# Patient Record
Sex: Female | Born: 1950 | Race: White | Hispanic: No | Marital: Married | Smoking: Never smoker
Health system: Southern US, Community
[De-identification: ages and names within clinical notes are randomized; demographics above are authoritative.]

## PROBLEM LIST (undated history)

## (undated) DIAGNOSIS — Z780 Asymptomatic menopausal state: Secondary | ICD-10-CM

## (undated) DIAGNOSIS — N809 Endometriosis, unspecified: Secondary | ICD-10-CM

## (undated) DIAGNOSIS — D490 Neoplasm of unspecified behavior of digestive system: Principal | ICD-10-CM

## (undated) DIAGNOSIS — E559 Vitamin D deficiency, unspecified: Secondary | ICD-10-CM

## (undated) DIAGNOSIS — Z8719 Personal history of other diseases of the digestive system: Secondary | ICD-10-CM

## (undated) DIAGNOSIS — E7800 Pure hypercholesterolemia, unspecified: Secondary | ICD-10-CM

## (undated) DIAGNOSIS — J309 Allergic rhinitis, unspecified: Secondary | ICD-10-CM

## (undated) DIAGNOSIS — F419 Anxiety disorder, unspecified: Secondary | ICD-10-CM

## (undated) DIAGNOSIS — C4491 Basal cell carcinoma of skin, unspecified: Secondary | ICD-10-CM

## (undated) DIAGNOSIS — Z8619 Personal history of other infectious and parasitic diseases: Secondary | ICD-10-CM

## (undated) DIAGNOSIS — K862 Cyst of pancreas: Secondary | ICD-10-CM

## (undated) HISTORY — DX: Neoplasm of unspecified behavior of digestive system: D49.0

## (undated) HISTORY — DX: Personal history of other infectious and parasitic diseases: Z86.19

## (undated) HISTORY — DX: Allergic rhinitis, unspecified: J30.9

## (undated) HISTORY — PX: TONSILLECTOMY AND ADENOIDECTOMY: SHX28

## (undated) HISTORY — DX: Basal cell carcinoma of skin, unspecified: C44.91

## (undated) HISTORY — DX: Personal history of other diseases of the digestive system: Z87.19

## (undated) HISTORY — DX: Anxiety disorder, unspecified: F41.9

## (undated) HISTORY — PX: BREAST FIBROADENOMA SURGERY: SHX580

## (undated) HISTORY — DX: Asymptomatic menopausal state: Z78.0

## (undated) HISTORY — DX: Pure hypercholesterolemia, unspecified: E78.00

## (undated) HISTORY — DX: Vitamin D deficiency, unspecified: E55.9

## (undated) HISTORY — DX: Cyst of pancreas: K86.2

## (undated) HISTORY — DX: Endometriosis, unspecified: N80.9

---

## 1983-11-06 HISTORY — PX: BREAST EXCISIONAL BIOPSY: SUR124

## 1998-03-07 ENCOUNTER — Other Ambulatory Visit: Admission: RE | Admit: 1998-03-07 | Discharge: 1998-03-07 | Payer: Self-pay | Admitting: Obstetrics & Gynecology

## 1999-08-01 ENCOUNTER — Other Ambulatory Visit: Admission: RE | Admit: 1999-08-01 | Discharge: 1999-08-01 | Payer: Self-pay | Admitting: Obstetrics & Gynecology

## 2000-08-28 ENCOUNTER — Other Ambulatory Visit: Admission: RE | Admit: 2000-08-28 | Discharge: 2000-08-28 | Payer: Self-pay | Admitting: Obstetrics & Gynecology

## 2001-09-01 ENCOUNTER — Other Ambulatory Visit: Admission: RE | Admit: 2001-09-01 | Discharge: 2001-09-01 | Payer: Self-pay | Admitting: Obstetrics & Gynecology

## 2002-12-07 ENCOUNTER — Other Ambulatory Visit: Admission: RE | Admit: 2002-12-07 | Discharge: 2002-12-07 | Payer: Self-pay | Admitting: Obstetrics and Gynecology

## 2002-12-08 ENCOUNTER — Other Ambulatory Visit: Admission: RE | Admit: 2002-12-08 | Discharge: 2002-12-08 | Payer: Self-pay | Admitting: Obstetrics & Gynecology

## 2003-05-03 ENCOUNTER — Other Ambulatory Visit: Admission: RE | Admit: 2003-05-03 | Discharge: 2003-05-03 | Payer: Self-pay | Admitting: Obstetrics & Gynecology

## 2004-01-11 ENCOUNTER — Other Ambulatory Visit: Admission: RE | Admit: 2004-01-11 | Discharge: 2004-01-11 | Payer: Self-pay | Admitting: Obstetrics & Gynecology

## 2005-04-16 ENCOUNTER — Other Ambulatory Visit: Admission: RE | Admit: 2005-04-16 | Discharge: 2005-04-16 | Payer: Self-pay | Admitting: Obstetrics & Gynecology

## 2011-10-12 ENCOUNTER — Encounter (INDEPENDENT_AMBULATORY_CARE_PROVIDER_SITE_OTHER): Payer: BC Managed Care – PPO | Admitting: Ophthalmology

## 2011-10-12 DIAGNOSIS — H2510 Age-related nuclear cataract, unspecified eye: Secondary | ICD-10-CM

## 2011-10-12 DIAGNOSIS — Q120 Congenital cataract: Secondary | ICD-10-CM

## 2011-10-12 DIAGNOSIS — H43819 Vitreous degeneration, unspecified eye: Secondary | ICD-10-CM

## 2011-10-19 ENCOUNTER — Encounter (INDEPENDENT_AMBULATORY_CARE_PROVIDER_SITE_OTHER): Payer: Self-pay | Admitting: Ophthalmology

## 2012-01-15 ENCOUNTER — Other Ambulatory Visit: Payer: Self-pay | Admitting: Physician Assistant

## 2012-06-11 ENCOUNTER — Encounter: Payer: Self-pay | Admitting: Internal Medicine

## 2012-06-11 ENCOUNTER — Other Ambulatory Visit: Payer: Self-pay | Admitting: Internal Medicine

## 2012-06-11 ENCOUNTER — Telehealth: Payer: Self-pay

## 2012-06-11 ENCOUNTER — Ambulatory Visit (INDEPENDENT_AMBULATORY_CARE_PROVIDER_SITE_OTHER): Payer: BC Managed Care – PPO | Admitting: Internal Medicine

## 2012-06-11 VITALS — BP 114/67 | HR 67 | Temp 98.1°F | Resp 16 | Ht 67.5 in | Wt 158.0 lb

## 2012-06-11 DIAGNOSIS — Z0000 Encounter for general adult medical examination without abnormal findings: Secondary | ICD-10-CM

## 2012-06-11 LAB — CBC WITH DIFFERENTIAL/PLATELET
Basophils Absolute: 0.1 10*3/uL (ref 0.0–0.1)
Eosinophils Absolute: 0.1 10*3/uL (ref 0.0–0.7)
Eosinophils Relative: 3 % (ref 0–5)
Lymphocytes Relative: 44 % (ref 12–46)
MCV: 91.1 fL (ref 78.0–100.0)
Neutrophils Relative %: 40 % — ABNORMAL LOW (ref 43–77)
Platelets: 207 10*3/uL (ref 150–400)
RDW: 13.6 % (ref 11.5–15.5)
WBC: 3.2 10*3/uL — ABNORMAL LOW (ref 4.0–10.5)

## 2012-06-11 LAB — TSH: TSH: 2.405 u[IU]/mL (ref 0.350–4.500)

## 2012-06-11 LAB — POCT UA - MICROSCOPIC ONLY: Yeast, UA: NEGATIVE

## 2012-06-11 LAB — COMPREHENSIVE METABOLIC PANEL
ALT: 12 U/L (ref 0–35)
AST: 14 U/L (ref 0–37)
Chloride: 105 mEq/L (ref 96–112)
Creat: 0.77 mg/dL (ref 0.50–1.10)
Sodium: 140 mEq/L (ref 135–145)
Total Bilirubin: 0.7 mg/dL (ref 0.3–1.2)

## 2012-06-11 LAB — POCT URINALYSIS DIPSTICK
Glucose, UA: NEGATIVE
Leukocytes, UA: NEGATIVE
Nitrite, UA: NEGATIVE

## 2012-06-11 LAB — LIPID PANEL
Total CHOL/HDL Ratio: 3.5 Ratio
VLDL: 14 mg/dL (ref 0–40)

## 2012-06-11 NOTE — Telephone Encounter (Signed)
Spoke to pt and informed I would forward message to [facility] in regards to sleep aids also would like Vit D level added on because she had Vit D performed last year and it was low so she has been taking Vit D supplement and wanted to see if it had improved.

## 2012-06-11 NOTE — Telephone Encounter (Signed)
Pt is wanting to know if the blood work that was drawn today was checking for vitamin D  And wants to know the three types of sleep aids that they discussed

## 2012-06-11 NOTE — Telephone Encounter (Signed)
Vit d yes Valerian root 400-600mg --[place] [facility] sleepytime tea--[facility] Melatonin 3mg -[place]

## 2012-06-12 ENCOUNTER — Encounter: Payer: Self-pay | Admitting: Internal Medicine

## 2012-06-12 NOTE — Telephone Encounter (Signed)
Called [place] and added on Vit D level spoke to [place].

## 2012-06-12 NOTE — Telephone Encounter (Signed)
Spoke w/Renee in Lab and they will call and have Vit D level added.

## 2012-06-12 NOTE — Telephone Encounter (Signed)
Left mssg for her to call me back 

## 2012-06-12 NOTE — Telephone Encounter (Signed)
Waiting for pt to call back to go over Sleep Aids, but need to add on Vit D level per Merla Riches.

## 2012-06-12 NOTE — Progress Notes (Signed)
  Subjective:    Patient ID: Elizabeth Cabrera, female    DOB: 1951-01-21, 61 y.o.   MRN: [id number]  HPIHere for annual exam Over the past 6 months has been experiencing with the gluten-free diet because of persistent long-term gastrointestinal issues that were finally linked to eating wheat-containing products These include lots of abdominal symptoms like bloating constipation or diarrhea appetite changes and also other issues like fatigue and myalgia. She is much better on a gluten-free diet/her daughter who shared in similar issues including infertility responded to the diet as well  Immunizations up to date Pap smear within normal limits last year/mammogram also/has GYN Exercise good-still complains of inability to lose last 10-15 pounds No other routine medical problems Except occasional insomnia for which she takes a fourth tablet of Xanax Husband With Aspergers/Stable relationship C. Issues with sleep/recent sleep study revealed no apnea/? 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 Routine general medical examination at a health care facility  CBC with Differential, Comprehensive metabolic panel, Lipid panel, TSH, POCT UA - Microscopic Only, POCT urinalysis dipstick  2  Insomnia 3.  HSV 4.  Gluten hypersensitivity-HO given Meds ordered this encounter  Medications  . ALPRAZolam (XANAX) 0.5 MG tablet    Sig: Take 0.5 mg by mouth at  bedtime as needed.  . valACYclovir (VALTREX) 1000 MG tablet    Sig: Take 1,000 mg by mouth 2 (two) times daily.  . calcium-vitamin D 250-100 MG-UNIT per tablet    Sig: Take 1 tablet by mouth 2 (two) times daily. 400 iu/500 mg  To call for refills as needed and followup in 1 year

## 2012-06-13 ENCOUNTER — Telehealth: Payer: Self-pay | Admitting: Radiology

## 2012-06-16 NOTE — Telephone Encounter (Signed)
Med encounter only 

## 2012-07-08 ENCOUNTER — Telehealth: Payer: Self-pay

## 2012-07-08 ENCOUNTER — Encounter: Payer: Self-pay | Admitting: Internal Medicine

## 2012-07-08 NOTE — Telephone Encounter (Signed)
ok 

## 2012-07-08 NOTE — Telephone Encounter (Signed)
Spoke with pt advised Vit D level. Pt understood

## 2012-07-08 NOTE — Telephone Encounter (Signed)
PATIENT REQUESTING RESULTS OF VITAMIN D TESTING.   BEST PHONE [phone]

## 2013-02-06 ENCOUNTER — Encounter: Payer: Self-pay | Admitting: Internal Medicine

## 2014-07-21 ENCOUNTER — Encounter: Payer: Self-pay | Admitting: Internal Medicine

## 2014-07-21 ENCOUNTER — Ambulatory Visit (INDEPENDENT_AMBULATORY_CARE_PROVIDER_SITE_OTHER): Payer: BC Managed Care – PPO | Admitting: Internal Medicine

## 2014-07-21 VITALS — BP 118/80 | HR 64 | Temp 98.7°F | Resp 16 | Ht 67.5 in | Wt 151.8 lb

## 2014-07-21 DIAGNOSIS — K900 Celiac disease: Secondary | ICD-10-CM

## 2014-07-21 DIAGNOSIS — Z0000 Encounter for general adult medical examination without abnormal findings: Secondary | ICD-10-CM

## 2014-07-21 DIAGNOSIS — R635 Abnormal weight gain: Secondary | ICD-10-CM

## 2014-07-21 MED ORDER — VALACYCLOVIR HCL 1 G PO TABS: 1000.0000 mg | ORAL_TABLET | Freq: Two times a day (BID) | ORAL | Status: DC

## 2014-07-21 MED ORDER — ALPRAZOLAM 0.5 MG PO TABS: 0.5000 mg | ORAL_TABLET | Freq: Every evening | ORAL | Status: DC | PRN

## 2014-07-22 LAB — LIPID PANEL
Cholesterol: 248 mg/dL — ABNORMAL HIGH (ref 0–200)
HDL: 101 mg/dL (ref >39)
LDL CALC: 139 mg/dL — AB (ref 0–99)
Total CHOL/HDL Ratio: 2.5 Ratio
Triglycerides: 40 mg/dL (ref <150)
VLDL: 8 mg/dL (ref 0–40)

## 2014-07-22 LAB — COMPLETE METABOLIC PANEL WITH GFR
ALBUMIN: 4.6 g/dL (ref 3.5–5.2)
ALT: 17 U/L (ref 0–35)
AST: 18 U/L (ref 0–37)
Alkaline Phosphatase: 50 U/L (ref 39–117)
BILIRUBIN TOTAL: 0.7 mg/dL (ref 0.2–1.2)
BUN: 10 mg/dL (ref 6–23)
CALCIUM: 9.6 mg/dL (ref 8.4–10.5)
CHLORIDE: 102 meq/L (ref 96–112)
CO2: 29 meq/L (ref 19–32)
Creat: 0.78 mg/dL (ref 0.50–1.10)
GFR, EST NON AFRICAN AMERICAN: 81 mL/min
GLUCOSE: 93 mg/dL (ref 70–99)
Potassium: 4.5 mEq/L (ref 3.5–5.3)
SODIUM: 138 meq/L (ref 135–145)
TOTAL PROTEIN: 7 g/dL (ref 6.0–8.3)

## 2014-07-22 LAB — CBC WITH DIFFERENTIAL/PLATELET
Basophils Absolute: 0 10*3/uL (ref 0.0–0.1)
Basophils Relative: 1 % (ref 0–1)
EOS ABS: 0.1 10*3/uL (ref 0.0–0.7)
Eosinophils Relative: 3 % (ref 0–5)
HCT: 42.1 % (ref 36.0–46.0)
HEMOGLOBIN: 14.3 g/dL (ref 12.0–15.0)
LYMPHS ABS: 1.8 10*3/uL (ref 0.7–4.0)
LYMPHS PCT: 41 % (ref 12–46)
MCH: 30.2 pg (ref 26.0–34.0)
MCHC: 34 g/dL (ref 30.0–36.0)
MCV: 89 fL (ref 78.0–100.0)
MONOS PCT: 10 % (ref 3–12)
Monocytes Absolute: 0.4 10*3/uL (ref 0.1–1.0)
NEUTROS PCT: 45 % (ref 43–77)
Neutro Abs: 2 10*3/uL (ref 1.7–7.7)
Platelets: 227 10*3/uL (ref 150–400)
RBC: 4.73 MIL/uL (ref 3.87–5.11)
RDW: 14 % (ref 11.5–15.5)
WBC: 4.4 10*3/uL (ref 4.0–10.5)

## 2014-07-22 LAB — POCT URINALYSIS DIPSTICK
Bilirubin, UA: NEGATIVE
Blood, UA: NEGATIVE
Glucose, UA: NEGATIVE
KETONES UA: NEGATIVE
Leukocytes, UA: NEGATIVE
Nitrite, UA: NEGATIVE
Protein, UA: NEGATIVE
Spec Grav, UA: <=1.005
Urobilinogen, UA: 0.2
pH, UA: 7

## 2014-07-22 LAB — VITAMIN B12: VITAMIN B 12: 557 pg/mL (ref 211–911)

## 2014-07-22 LAB — TSH: TSH: 2.397 u[IU]/mL (ref 0.350–4.500)
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
## 2014-07-26 ENCOUNTER — Encounter: Payer: Self-pay | Admitting: Internal Medicine

## 2014-07-27 LAB — HM PAP SMEAR: HM PAP: NEGATIVE

## 2014-07-28 ENCOUNTER — Encounter: Payer: Self-pay | Admitting: Family Medicine

## 2014-07-30 ENCOUNTER — Telehealth: Payer: Self-pay | Admitting: *Deleted

## 2014-07-30 NOTE — Telephone Encounter (Signed)
Labs all ok --results sent last week in letter??

## 2014-07-30 NOTE — Telephone Encounter (Signed)
Spoke to pt advised results.  Pt request email to get access code to [place]. I have verified with her name/DOB and last four of social. Emailed to her the code after resetting.

## 2014-07-30 NOTE — Telephone Encounter (Signed)
Pt called regarding lab results.  Can you review them?

## 2015-05-28 ENCOUNTER — Telehealth: Payer: Self-pay | Admitting: Family Medicine

## 2015-05-28 NOTE — Telephone Encounter (Signed)
lmom to call and reschedule her appt that she had with Laney Pastor on 07/27/15 he will not be in the clinic that day

## 2015-07-27 ENCOUNTER — Encounter: Payer: Self-pay | Admitting: Internal Medicine

## 2015-08-05 LAB — HM MAMMOGRAPHY

## 2015-08-10 ENCOUNTER — Encounter: Payer: Self-pay | Admitting: Internal Medicine

## 2015-09-21 ENCOUNTER — Encounter: Payer: Self-pay | Admitting: Internal Medicine

## 2015-12-14 ENCOUNTER — Telehealth: Payer: Self-pay | Admitting: Family Medicine

## 2015-12-14 NOTE — Telephone Encounter (Signed)
Spoke with patient and she had her mammogram and pap smear done at [facility].  Called them and they will fax over the reports.

## 2015-12-19 ENCOUNTER — Encounter: Payer: Self-pay | Admitting: Family Medicine

## 2015-12-21 ENCOUNTER — Ambulatory Visit: Payer: Self-pay | Admitting: Internal Medicine

## 2016-01-25 ENCOUNTER — Encounter: Payer: Self-pay | Admitting: Internal Medicine

## 2016-01-25 ENCOUNTER — Ambulatory Visit (INDEPENDENT_AMBULATORY_CARE_PROVIDER_SITE_OTHER): Payer: BC Managed Care – PPO | Admitting: Internal Medicine

## 2016-01-25 VITALS — BP 163/76 | HR 66 | Temp 97.7°F | Resp 18 | Ht 67.5 in | Wt 159.0 lb

## 2016-01-25 DIAGNOSIS — Z0000 Encounter for general adult medical examination without abnormal findings: Secondary | ICD-10-CM | POA: Diagnosis not present

## 2016-01-25 DIAGNOSIS — R252 Cramp and spasm: Secondary | ICD-10-CM

## 2016-01-25 DIAGNOSIS — E7800 Pure hypercholesterolemia, unspecified: Secondary | ICD-10-CM | POA: Diagnosis not present

## 2016-01-25 DIAGNOSIS — K900 Celiac disease: Secondary | ICD-10-CM

## 2016-01-25 NOTE — Patient Instructions (Addendum)
[facility] [email or web address]       IF you received an x-ray today, you will receive an invoice from [facility]. Please contact [facility] at [phone] with questions or concerns regarding your invoice.   IF you received labwork today, you will receive an invoice from [employer]. Please contact [facility] at [phone] with questions or concerns regarding your invoice.   Our billing staff will not be able to assist you with questions regarding bills from these companies.  You will be contacted with the lab results as soon as they are available. The fastest way to get your results is to activate your My Chart account. Instructions are located on the last page of this paperwork. If you have not heard from [place] regarding the results in 2 weeks, please contact this office.

## 2016-01-26 ENCOUNTER — Telehealth: Payer: Self-pay

## 2016-01-26 LAB — CBC WITH DIFFERENTIAL/PLATELET
BASOS PCT: 2 % — AB (ref 0–1)
Basophils Absolute: 0.1 10*3/uL (ref 0.0–0.1)
EOS ABS: 0.1 10*3/uL (ref 0.0–0.7)
Eosinophils Relative: 2 % (ref 0–5)
HCT: 41.4 % (ref 36.0–46.0)
Hemoglobin: 13.9 g/dL (ref 12.0–15.0)
Lymphocytes Relative: 38 % (ref 12–46)
Lymphs Abs: 1.8 10*3/uL (ref 0.7–4.0)
MCH: 29.8 pg (ref 26.0–34.0)
MCHC: 33.6 g/dL (ref 30.0–36.0)
MCV: 88.8 fL (ref 78.0–100.0)
MONO ABS: 0.4 10*3/uL (ref 0.1–1.0)
MONOS PCT: 8 % (ref 3–12)
MPV: 11.8 fL (ref 8.6–12.4)
NEUTROS PCT: 50 % (ref 43–77)
Neutro Abs: 2.4 10*3/uL (ref 1.7–7.7)
PLATELETS: 230 10*3/uL (ref 150–400)
RBC: 4.66 MIL/uL (ref 3.87–5.11)
RDW: 14 % (ref 11.5–15.5)
WBC: 4.8 10*3/uL (ref 4.0–10.5)

## 2016-01-26 LAB — COMPREHENSIVE METABOLIC PANEL
ALT: 13 U/L (ref 6–29)
AST: 16 U/L (ref 10–35)
Albumin: 4.5 g/dL (ref 3.6–5.1)
Alkaline Phosphatase: 45 U/L (ref 33–130)
BUN: 16 mg/dL (ref 7–25)
CHLORIDE: 101 mmol/L (ref 98–110)
CO2: 26 mmol/L (ref 20–31)
CREATININE: 0.78 mg/dL (ref 0.50–0.99)
Calcium: 9.7 mg/dL (ref 8.6–10.4)
Glucose, Bld: 88 mg/dL (ref 65–99)
Potassium: 4 mmol/L (ref 3.5–5.3)
SODIUM: 139 mmol/L (ref 135–146)
Total Bilirubin: 0.9 mg/dL (ref 0.2–1.2)
Total Protein: 7.2 g/dL (ref 6.1–8.1)

## 2016-01-26 LAB — LIPID PANEL
Cholesterol: 275 mg/dL — ABNORMAL HIGH (ref 125–200)
HDL: 104 mg/dL (ref >=46)
LDL CALC: 160 mg/dL — AB (ref <130)
Total CHOL/HDL Ratio: 2.6 Ratio (ref <=5.0)
Triglycerides: 53 mg/dL (ref <150)
VLDL: 11 mg/dL (ref <30)

## 2016-01-26 LAB — HEPATITIS C ANTIBODY: HCV AB: NEGATIVE

## 2016-01-26 LAB — CK: Total CK: 53 U/L (ref 7–177)

## 2016-01-26 LAB — TSH: TSH: 1.69 mIU/L

## 2016-01-26 NOTE — Telephone Encounter (Signed)
Pt was calling to find out code for ordered lab: CELIAC DISEASE HLA DQ ASSOC.// called clinical TL and Lab Techs; attempted code search but they informed me that this is that type of lab that will processed at [place]. Provided pt alt number to contact [facility] for code @ [phone].

## 2016-01-27 ENCOUNTER — Other Ambulatory Visit: Payer: Self-pay | Admitting: *Deleted

## 2016-01-27 DIAGNOSIS — K900 Celiac disease: Secondary | ICD-10-CM

## 2016-01-27 NOTE — Progress Notes (Signed)
Subjective:    Patient ID: Elizabeth Cabrera, female    DOB: Jul 09, 1951, 65 y.o.   MRN: [id number]  HPIannual Patient Active Problem List   Diagnosis Date Noted  . Elevated cholesterol 01/25/2016    -  ?gluten Hypersensitivity  She has been on a gluten-free diet for over 2 years and has tremendous IBS type symptoms whenever she comes in contact with gluten. Diagnosis has never been made by biopsy or positive blood test. There are several family members (daughter, son, 2 grandkids) share the same problem and our own gluten-free diets. She has problems with occasional scalp dermatitis and various skin bumps that come and go without definition Last colo- 2009 wnl  Active Ambulatory Problems    Diagnosis Date Noted  . Elevated cholesterol 01/25/2016  Never meets defn for rx  Past Medical History  Diagnosis Date  . History of IBS   . History of HPV infection   . Menopause   . Anxiety   . AR (allergic rhinitis)   . BCC (basal cell carcinoma)   . 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 Brother died at 85 the heart attack. All the brothers were smokers and overweight.  Review of Systems 14pt per formoccas anx esp w/ work and relat issues occas lightheadedness w/out sequelae Various joint pains especially when stress at her job//especially right shoulder and right elbow She has muscle cramps feet and calves on and off. Very occasional hand cramps at night. She had thick toenails thought to be secondary to fungal infection which resolved over the few years she has been on gluten-free diet Otherwise neg    Objective:   Physical Exam  Constitutional: She is oriented to person, place, and time. She appears well-developed and well-nourished. No distress.  HENT:  Head: Normocephalic.  Right Ear:  External ear normal.  Left Ear: External ear normal.  Nose: Nose normal.  Mouth/Throat: Oropharynx is clear and moist.  Eyes: Conjunctivae and EOM are normal. Pupils are equal, round, and reactive to light.  Neck: Normal range of motion. Neck supple. No thyromegaly present.  Cardiovascular: Normal rate, regular rhythm, normal heart sounds and intact distal pulses.   No murmur heard. Pulmonary/Chest: Effort normal and breath sounds normal. She has no wheezes.  Abdominal: Soft. Bowel sounds are normal. She exhibits no distension and no mass. There is no tenderness. There is no rebound.  Musculoskeletal: Normal range of motion. She exhibits no edema or tenderness.  Lymphadenopathy:    She has no cervical adenopathy.  Neurological: She is alert and oriented to person, place, and time. She has normal reflexes. No cranial nerve deficit.  Skin: Skin is warm and dry. No rash noted.  Psychiatric: She has a normal mood and affect. Her behavior is normal. Judgment and thought content normal.  Nursing note and vitals reviewed. BP 163/76 mmHg  Pulse 66  Temp(Src) 97.7 F (36.5 C)  Resp 18  Ht 5' 7.5" (1.715 m)  Wt 159 lb (72.122 kg)  BMI 24.52 kg/m2  155/75 recheck  EKG within normal limits     Assessment & Plan:  Annual physical exam -  Elevated cholesterol  Transient gluten sensitivity -?Celiac disease - Plan: Celiac Disease [facility] to be done at [facility]  Muscle cramps - Plan: CK///hydration///potassium rich foods  Elevated blood  pressure without diagnosis of hypertension--home blood pressures  Labs Results for orders placed or performed in visit on 01/25/16  CBC with Differential/Platelet  Result Value Ref Range   WBC 4.8 4.0 - 10.5 K/uL   RBC 4.66 3.87 - 5.11 MIL/uL   Hemoglobin 13.9 12.0 - 15.0 g/dL   HCT 41.4 36.0 - 46.0 %   MCV 88.8 78.0 - 100.0 fL   MCH 29.8 26.0 - 34.0 pg   MCHC 33.6 30.0 - 36.0 g/dL   RDW 14.0 11.5 - 15.5 %   Platelets 230 150 - 400 K/uL    MPV 11.8 8.6 - 12.4 fL   Neutrophils Relative % 50 43 - 77 %   Neutro Abs 2.4 1.7 - 7.7 K/uL   Lymphocytes Relative 38 12 - 46 %   Lymphs Abs 1.8 0.7 - 4.0 K/uL   Monocytes Relative 8 3 - 12 %   Monocytes Absolute 0.4 0.1 - 1.0 K/uL   Eosinophils Relative 2 0 - 5 %   Eosinophils Absolute 0.1 0.0 - 0.7 K/uL   Basophils Relative 2 (H) 0 - 1 %   Basophils Absolute 0.1 0.0 - 0.1 K/uL   Smear Review Criteria for review not met   Comprehensive metabolic panel  Result Value Ref Range   Sodium 139 135 - 146 mmol/L   Potassium 4.0 3.5 - 5.3 mmol/L   Chloride 101 98 - 110 mmol/L   CO2 26 20 - 31 mmol/L   Glucose, Bld 88 65 - 99 mg/dL   BUN 16 7 - 25 mg/dL   Creat 0.78 0.50 - 0.99 mg/dL   Total Bilirubin 0.9 0.2 - 1.2 mg/dL   Alkaline Phosphatase 45 33 - 130 U/L   AST 16 10 - 35 U/L   ALT 13 6 - 29 U/L   Total Protein 7.2 6.1 - 8.1 g/dL   Albumin 4.5 3.6 - 5.1 g/dL   Calcium 9.7 8.6 - 10.4 mg/dL  Lipid panel  Result Value Ref Range   Cholesterol 275 (H) 125 - 200 mg/dL   Triglycerides 53 <150 mg/dL   HDL 104 >=46 mg/dL   Total CHOL/HDL Ratio 2.6 <=5.0 Ratio   VLDL 11 <30 mg/dL   LDL Cholesterol 160 (H) <130 mg/dL  TSH  Result Value Ref Range   TSH 1.69 mIU/L  Hepatitis C antibody  Result Value Ref Range   HCV Ab NEGATIVE NEGATIVE  CK  Result Value Ref Range   Total CK 53 7 - 177 U/L    It is important to determine if she has hypertension she will do home blood pressure recordings If she is not hypertensive she can approach this cholesterol particularly because of her HDL

## 2016-01-28 ENCOUNTER — Encounter: Payer: Self-pay | Admitting: Internal Medicine

## 2016-01-28 DIAGNOSIS — Z8249 Family history of ischemic heart disease and other diseases of the circulatory system: Secondary | ICD-10-CM

## 2016-01-28 DIAGNOSIS — E7800 Pure hypercholesterolemia, unspecified: Secondary | ICD-10-CM

## 2016-01-29 MED ORDER — VALACYCLOVIR HCL 500 MG PO TABS: 500.0000 mg | ORAL_TABLET | Freq: Two times a day (BID) | ORAL | Status: AC

## 2016-01-30 NOTE — Addendum Note (Signed)
Addended by: Wyatt Haste on: 01/30/2016 07:48 AM   Modules accepted: Miquel Dunn

## 2016-02-02 LAB — CELIAC DISEASE HLA DQ ASSOC.
DQ2 (DQA1 0501/0505, DQB1 02XX): NEGATIVE
DQ8 (DQA1 03XX, DQB1 0302): NEGATIVE

## 2016-02-03 ENCOUNTER — Telehealth: Payer: Self-pay

## 2016-02-03 NOTE — Telephone Encounter (Signed)
Pt called requesting lab results. Please review results. Thank you.

## 2016-02-04 NOTE — Telephone Encounter (Signed)
Please tell pt to check her mychart. Dr. Laney Pastor sent her a detailed message regarding her results on 3/30

## 2016-02-06 NOTE — Telephone Encounter (Signed)
Pt responded .

## 2016-02-12 ENCOUNTER — Encounter: Payer: Self-pay | Admitting: Internal Medicine

## 2016-03-09 ENCOUNTER — Ambulatory Visit (INDEPENDENT_AMBULATORY_CARE_PROVIDER_SITE_OTHER): Payer: BC Managed Care – PPO | Admitting: Physician Assistant

## 2016-03-09 VITALS — BP 134/86 | HR 83 | Temp 98.7°F | Resp 17 | Ht 67.5 in | Wt 161.0 lb

## 2016-03-09 DIAGNOSIS — J069 Acute upper respiratory infection, unspecified: Secondary | ICD-10-CM | POA: Diagnosis not present

## 2016-03-09 DIAGNOSIS — M5412 Radiculopathy, cervical region: Secondary | ICD-10-CM | POA: Diagnosis not present

## 2016-03-09 DIAGNOSIS — B9789 Other viral agents as the cause of diseases classified elsewhere: Principal | ICD-10-CM

## 2016-03-09 MED ORDER — PREDNISONE 20 MG PO TABS: 40.0000 mg | ORAL_TABLET | Freq: Every day | ORAL | Status: DC
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
## 2016-03-09 NOTE — Patient Instructions (Addendum)
For nasal congestion Mucinex is safe.  You may add Zyrtec-D to this to help with allergies and nasal congestion, however prednisone will likely help with this as well.  If your neck and shoulder are not feeling better in 10 days then please return to clinic for neck radiographs.    IF you received an x-ray today, you will receive an invoice from [facility]. Please contact [facility] Radiology at [phone] with questions or concerns regarding your invoice.   IF you received labwork today, you will receive an invoice from [employer]. Please contact [facility] at [phone] with questions or concerns regarding your invoice.   Our billing staff will not be able to assist you with questions regarding bills from these companies.  You will be contacted with the lab results as soon as they are available. The fastest way to get your results is to activate your My Chart account. Instructions are located on the last page of this paperwork. If you have not heard from [place] regarding the results in 2 weeks, please contact this office.

## 2016-03-31 ENCOUNTER — Ambulatory Visit (INDEPENDENT_AMBULATORY_CARE_PROVIDER_SITE_OTHER): Payer: BC Managed Care – PPO | Admitting: Family Medicine

## 2016-03-31 ENCOUNTER — Ambulatory Visit (INDEPENDENT_AMBULATORY_CARE_PROVIDER_SITE_OTHER): Payer: BC Managed Care – PPO

## 2016-03-31 VITALS — BP 138/76 | HR 60 | Temp 97.8°F | Resp 17 | Ht 67.5 in | Wt 162.1 lb

## 2016-03-31 DIAGNOSIS — M542 Cervicalgia: Secondary | ICD-10-CM | POA: Diagnosis not present

## 2016-03-31 DIAGNOSIS — M5412 Radiculopathy, cervical region: Secondary | ICD-10-CM | POA: Diagnosis not present

## 2016-03-31 MED ORDER — MELOXICAM 7.5 MG PO TABS: 7.5000 mg | ORAL_TABLET | Freq: Every day | ORAL | Status: DC

## 2016-03-31 MED ORDER — CYCLOBENZAPRINE HCL 5 MG PO TABS: ORAL_TABLET | ORAL | Status: DC
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
## 2016-03-31 NOTE — Patient Instructions (Addendum)
IF you received an x-ray today, you will receive an invoice from [facility]. Please contact [facility] at [phone] with questions or concerns regarding your invoice.   IF you received labwork today, you will receive an invoice from [employer]. Please contact [facility] at [phone] with questions or concerns regarding your invoice.   Our billing staff will not be able to assist you with questions regarding bills from these companies.  You will be contacted with the lab results as soon as they are available. The fastest way to get your results is to activate your My Chart account. Instructions are located on the last page of this paperwork. If you have not heard from [place] regarding the results in 2 weeks, please contact this office.    Meloxicam 1 pill once per day. Do not combine this with other anti-inflammatories including Aleve. You can take Tylenol if needed. You can also take Flexeril (muscle relaxant) at bedtime as needed. If symptoms are not improving in the next 2 weeks, consider MRI for possible pinched nerve or herniated disc. Sooner  if worse.  Cervical Radiculopathy Cervical radiculopathy happens when a nerve in the neck (cervical nerve) is pinched or bruised. This condition can develop because of an injury or as part of the normal aging process. Pressure on the cervical nerves can cause pain or numbness that runs from the neck all the way down into the arm and fingers. Usually, this condition gets better with rest. Treatment may be needed if the condition does not improve.  CAUSES This condition may be caused by:  Injury.  Slipped (herniated) disk.  Muscle tightness in the neck because of overuse.  Arthritis.  Breakdown or degeneration in the bones and joints of the spine (spondylosis) due to aging.  Bone spurs that may develop near the cervical nerves. SYMPTOMS Symptoms of this condition include:  Pain that runs  from the neck to the arm and hand. The pain can be severe or irritating. It may be worse when the neck is moved.  Numbness or weakness in the affected arm and hand. DIAGNOSIS This condition may be diagnosed based on symptoms, medical history, and a physical exam. You may also have tests, including:  X-rays.  CT scan.  MRI.  Electromyogram (EMG).  Nerve conduction tests. TREATMENT In many cases, treatment is not needed for this condition. With rest, the condition usually gets better over time. If treatment is needed, options may include:  Wearing a soft neck collar for short periods of time.  Physical therapy to strengthen your neck muscles.  Medicines, such as NSAIDs, oral corticosteroids, or spinal injections.  Surgery. This may be needed if other treatments do not help. Various types of surgery may be done depending on the cause of your problems. HOME CARE INSTRUCTIONS Managing Pain  Take over-the-counter and prescription medicines only as told by your health care provider.  If directed, apply ice to the affected area.  Put ice in a plastic bag.  Place a towel between your skin and the bag.  Leave the ice on for 20 minutes, 2-3 times per day.  If ice does not help, you can try using heat. Take a warm shower or warm bath, or use a heat pack as told by your health care provider.  Try a gentle neck and shoulder massage to help relieve symptoms. Activity  Rest as needed. Follow instructions from your health care provider about any restrictions on activities.  Do stretching and  strengthening exercises as told by your health care provider or physical therapist. General Instructions  If you were given a soft collar, wear it as told by your health care provider.  Use a flat pillow when you sleep.  Keep all follow-up visits as told by your health care provider. This is important. SEEK MEDICAL CARE IF:  Your condition does not improve with treatment. SEEK IMMEDIATE  MEDICAL CARE IF:  Your pain gets much worse and cannot be controlled with medicines.  You have weakness or numbness in your hand, arm, face, or leg.  You have a high fever.  You have a stiff, rigid neck.  You lose control of your bowels or your bladder (have incontinence).  You have trouble with walking, balance, or speaking.   This information is not intended to replace advice given to you by your health care provider. Make sure you discuss any questions you have with your health care provider.   Document Released: 07/17/2001 Document Revised: 07/13/2015 Document Reviewed: 12/16/2014 Elsevier Interactive Patient Education [employer].

## 2016-06-11 DIAGNOSIS — K7689 Other specified diseases of liver: Secondary | ICD-10-CM | POA: Insufficient documentation

## 2016-06-11 DIAGNOSIS — K121 Other forms of stomatitis: Secondary | ICD-10-CM | POA: Insufficient documentation

## 2016-06-11 DIAGNOSIS — R5382 Chronic fatigue, unspecified: Secondary | ICD-10-CM | POA: Insufficient documentation

## 2016-06-11 DIAGNOSIS — G8929 Other chronic pain: Secondary | ICD-10-CM | POA: Insufficient documentation

## 2016-06-11 DIAGNOSIS — K862 Cyst of pancreas: Secondary | ICD-10-CM | POA: Insufficient documentation

## 2016-06-11 DIAGNOSIS — R140 Abdominal distension (gaseous): Secondary | ICD-10-CM | POA: Insufficient documentation

## 2016-08-09 ENCOUNTER — Other Ambulatory Visit: Payer: Self-pay | Admitting: Obstetrics & Gynecology

## 2016-08-09 DIAGNOSIS — R928 Other abnormal and inconclusive findings on diagnostic imaging of breast: Secondary | ICD-10-CM

## 2016-08-21 ENCOUNTER — Ambulatory Visit
Admission: RE | Admit: 2016-08-21 | Discharge: 2016-08-21 | Disposition: A | Discharge status: Home / Self Care | Payer: BLUE CROSS/BLUE SHIELD | Source: Ambulatory Visit | Attending: Obstetrics & Gynecology | Admitting: Obstetrics & Gynecology

## 2016-08-21 DIAGNOSIS — R928 Other abnormal and inconclusive findings on diagnostic imaging of breast: Secondary | ICD-10-CM

## 2017-01-01 DIAGNOSIS — D490 Neoplasm of unspecified behavior of digestive system: Secondary | ICD-10-CM | POA: Insufficient documentation

## 2017-01-01 HISTORY — DX: Neoplasm of unspecified behavior of digestive system: D49.0

## 2017-05-28 ENCOUNTER — Telehealth: Payer: Self-pay | Admitting: Internal Medicine

## 2017-05-28 NOTE — Telephone Encounter (Signed)
Pt states she is wanting to transfer here due to convenience and being closer to home. Records have been placed on Dr.Gessner's desk for review.

## 2017-06-13 NOTE — Telephone Encounter (Signed)
Dr.Gessner reviewed records and accepted to see patient and be scheduled for next available office visit.  Left message for patient to call back and schedule appointment.

## 2017-06-14 ENCOUNTER — Encounter: Payer: Self-pay | Admitting: Internal Medicine

## 2017-08-16 ENCOUNTER — Ambulatory Visit (INDEPENDENT_AMBULATORY_CARE_PROVIDER_SITE_OTHER): Payer: BC Managed Care – PPO | Admitting: Internal Medicine

## 2017-08-16 ENCOUNTER — Encounter: Payer: Self-pay | Admitting: Internal Medicine

## 2017-08-16 VITALS — BP 128/76 | HR 78 | Ht 67.5 in | Wt 155.2 lb

## 2017-08-16 DIAGNOSIS — D490 Neoplasm of unspecified behavior of digestive system: Secondary | ICD-10-CM

## 2017-08-16 DIAGNOSIS — K9041 Non-celiac gluten sensitivity: Secondary | ICD-10-CM | POA: Diagnosis not present

## 2017-08-16 NOTE — Assessment & Plan Note (Signed)
Continue current diet restrictions they seem to work

## 2017-08-16 NOTE — Assessment & Plan Note (Signed)
Incidental finding, is a premalignant lesion. We have decided to plan for follow-up imaging in 18 months from the initial MRI. She is to get the disc if it is not available through primary care (we are checking) so she has that for comparison purposes then.

## 2017-08-16 NOTE — Patient Instructions (Signed)
  We need to obtain the disc of the MRI you've had at [facility] [place] [facility] will need a MRI/MRCP next July , we will contact you to set this up.   I appreciate the opportunity to care for you. Silvano Rusk, MD,FACG

## 2017-08-16 NOTE — Progress Notes (Signed)
Elizabeth Cabrera 66 y.o. Apr 13, 1951 [id number] Referred by: Dr. 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 We have decided to plan for follow-up imaging in 18 months from the initial MRI. She is to get the disc if it is not available through primary care (we are checking) so she has that for comparison purposes then.  Non-celiac gluten sensitivity Continue current diet restrictions they seem to work  She has done FOBT for colon cancer screening and she plans to continue that through primary care. Negative colonoscopy 2008  I appreciate the opportunity to care for this patient. 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 Additional 8 mm cystic lesion at the pancreatic body without communication to the pancreatic duct. This can also be followed up at surveillance imaging in 2-3 years. 2.Partial pancreatic divisum. 3.Scattered tiny hepatic cysts.  This was over at [facility], she has years of gluten sensitivity-like symptoms and some family history of the same, and she did have testing for HLA genes linked to celiac that were negative years ago through Dr. Laney Pastor. 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 However it was recommended that she do a 2 week gluten challenge and have an EGD with biopsies but the patient says she does not  want to do that and she didn't. She does well with management of diet. She says that sometimes her bowels alternate but overall bloating gas and flatulence etc. are much better with gluten restriction. She had a negative colonoscopy in 2008 and then sometime in the last year had negative immune based FOBT and does not want to do colonoscopies if she can help it. Occasional spasm in the esophagus and rectum. Diagnosed with irritable bowel syndrome 35-40 years ago. No Known Allergies Current Meds  Medication Sig  . ALPRAZolam (XANAX) 0.5 MG tablet Take 1 tablet (0.5 mg total) by mouth at bedtime as needed.  . Calcium Carbonate-Vitamin D3 (CALCIUM 600+D3) 600-400 MG-UNIT TABS Take by mouth.  . Magnesium 300 MG CAPS Take 400 mg by mouth.   . Multiple Vitamins-Minerals (MULTIVITAMIN ADULT PO) Take by mouth.  Marland Kitchen OVER THE COUNTER MEDICATION   . valACYclovir (VALTREX) 500 MG tablet Take 1 tablet (500 mg total) by mouth 2 (two) times daily.   Past Medical History:  Diagnosis Date  . Anxiety   . AR (allergic rhinitis)   . BCC (basal cell carcinoma)   . Elevated cholesterol   . Endometriosis   . History of HPV infection   . History of IBS   . 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 Additional 8 mm cystic lesion at the pancreatic body without communication to the pancreatic duct. This can also be followed up at surveillance imaging in 2-3 years. 2.Partial pancreatic  divisum. 3.Scattered tiny hepatic cysts.  . Menopause   . Pancreatic cyst   . Vitamin D deficiency    Past Surgical History:  Procedure Laterality Date  . BREAST FIBROADENOMA SURGERY    . TONSILLECTOMY AND ADENOIDECTOMY     Social History   Social History  . Marital status: Married    Spouse name: Merry Proud  . Number of children: 2  . Years of education: 16 at least     Occupational History  . teacher [employer]   Social History Main Topics  . Smoking status: Never Smoker  . Smokeless tobacco: Never Used  . Alcohol use 4.2 oz/week    7 Glasses of wine per week  . 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 One son and one daughter she has grandchildren. 2 caffeinated drinks a day   family history includes Arthritis in her mother; Brain cancer in her father; CAD in her brother; Cancer in her father; Colon cancer in her paternal uncle; Diabetes in her brother and brother; Heart disease in her brother, brother, and mother; Heart failure in her brother and mother; Hyperlipidemia in her brother; Hypertension in her brother; Lung cancer in her brother; Mental illness in her maternal grandmother; Prostate cancer in her brother; Sleep apnea in her father; Stroke in her brother.   Review of Systems As per history of present illness. She denies any other problems on complete review of systems.  Objective:   Physical Exam _0  128/76   Pulse 78   Ht 5' 7.5" (1.715 m)   Wt 155 lb 3.2 oz (70.4 kg)   SpO2 98%   BMI 23.95 kg/m @  General:  Well-developed, well-nourished and in no acute distress Eyes:  anicteric. Lungs: Clear to auscultation bilaterally. Heart:  S1S2, no rubs, murmurs, gallops. Abdomen:  soft, non-tender, no hepatosplenomegaly, hernia, or mass and BS+.  Extremities:   no edema, cyanosis or clubbing Skin   no rash. Neuro:  A&O x 3.  Psych:  appropriate mood and  Affect.   Data Reviewed:  See history of present illness. I have reviewed GI notes at [facility] from 2017 2018. I have reviewed the imaging.

## 2017-08-19 ENCOUNTER — Telehealth: Payer: Self-pay

## 2017-08-19 NOTE — Telephone Encounter (Signed)
Called and spoke to [place] at Dr Ladell Heads office [phone]) , they have the MRI CD (done at [facility]) and they will put it up front for her to come and pick up.  I called and left her a detailed message for her with this information.  She needs to take this CD with her to the abdominal MRI/MRCP next July 2019 for her dx-IPMN.  Reminder sent to myself to set up closer to the date.

## 2017-09-04 ENCOUNTER — Other Ambulatory Visit: Payer: Self-pay | Admitting: Obstetrics & Gynecology

## 2017-09-04 DIAGNOSIS — N644 Mastodynia: Secondary | ICD-10-CM

## 2017-09-12 ENCOUNTER — Ambulatory Visit: Payer: Self-pay

## 2017-09-12 ENCOUNTER — Ambulatory Visit
Admission: RE | Admit: 2017-09-12 | Discharge: 2017-09-12 | Disposition: A | Discharge status: Home / Self Care | Payer: BC Managed Care – PPO | Source: Ambulatory Visit | Attending: Obstetrics & Gynecology | Admitting: Obstetrics & Gynecology

## 2017-09-12 DIAGNOSIS — N644 Mastodynia: Secondary | ICD-10-CM

## 2017-11-06 ENCOUNTER — Telehealth: Payer: Self-pay | Admitting: Internal Medicine

## 2017-11-06 NOTE — Telephone Encounter (Signed)
Spoke with [place] and she said they mailed her the CD.  I told her to hold on to it for her July 2019 MRI/MRCP scan.

## 2018-04-14 ENCOUNTER — Telehealth: Payer: Self-pay

## 2018-04-14 DIAGNOSIS — D490 Neoplasm of unspecified behavior of digestive system: Secondary | ICD-10-CM

## 2018-04-14 NOTE — Telephone Encounter (Signed)
Left her a voicemail to call me back to get good dates/times for her to do this MRI in July. Also need to know where she would like this done.

## 2018-04-14 NOTE — Telephone Encounter (Signed)
-----   Message from Lisaanne Lawrie E [place], [place] sent at 08/16/2017  4:48 PM EDT ----- Pt needs an abdominal  MRI/MRCP , dx IPMN , she is due in July 2019 for this.  She must take the disc from the MRI she had at [facility] for them to compare with.  Remind her.  See office note from 08/16/17 for more information.

## 2018-04-15 NOTE — Telephone Encounter (Signed)
I ordered this MRI/MRCP at [place] since patient wants an open machine. They are going to contact her to screen her and set her up for this MRI. They will do the BUN and creatine there. She is aware to take the CD from the one she had at [facility].

## 2018-04-15 NOTE — Telephone Encounter (Signed)
Patient returning call to set MRI date in July.

## 2018-05-13 ENCOUNTER — Ambulatory Visit
Admission: RE | Admit: 2018-05-13 | Discharge: 2018-05-13 | Disposition: A | Discharge status: Home / Self Care | Payer: BC Managed Care – PPO | Source: Ambulatory Visit | Attending: Internal Medicine | Admitting: Internal Medicine

## 2018-05-13 ENCOUNTER — Other Ambulatory Visit: Payer: Self-pay | Admitting: Internal Medicine

## 2018-05-13 ENCOUNTER — Ambulatory Visit
Admission: RE | Admit: 2018-05-13 | Discharge: 2018-05-13 | Disposition: A | Discharge status: Home / Self Care | Payer: Self-pay | Source: Ambulatory Visit | Attending: Internal Medicine | Admitting: Internal Medicine

## 2018-05-13 DIAGNOSIS — D490 Neoplasm of unspecified behavior of digestive system: Secondary | ICD-10-CM

## 2018-05-13 MED ORDER — GADOBENATE DIMEGLUMINE 529 MG/ML IV SOLN
14.0000 mL | Freq: Once | INTRAVENOUS | Status: AC | PRN
  Administered 2018-05-13: 14 mL via INTRAVENOUS

## 2018-05-15 NOTE — Progress Notes (Signed)
The tiny (<1 cm) lesions in pancreas are w/o change This is good news and we do not think anything bad going on Guidelines recommend repeat in 2 years so let's plan on a 05/2020 recall MRI/MRCP

## 2019-05-15 ENCOUNTER — Other Ambulatory Visit: Payer: Self-pay | Admitting: Family Medicine

## 2019-05-15 DIAGNOSIS — E2839 Other primary ovarian failure: Secondary | ICD-10-CM

## 2019-05-15 DIAGNOSIS — M810 Age-related osteoporosis without current pathological fracture: Secondary | ICD-10-CM

## 2019-05-29 ENCOUNTER — Other Ambulatory Visit: Payer: Self-pay | Admitting: Family Medicine

## 2019-05-29 DIAGNOSIS — Z1231 Encounter for screening mammogram for malignant neoplasm of breast: Secondary | ICD-10-CM

## 2019-09-24 ENCOUNTER — Other Ambulatory Visit: Payer: Self-pay

## 2019-09-24 ENCOUNTER — Ambulatory Visit
Admission: RE | Admit: 2019-09-24 | Discharge: 2019-09-24 | Disposition: A | Discharge status: Home / Self Care | Payer: BC Managed Care – PPO | Source: Ambulatory Visit | Attending: Family Medicine | Admitting: Family Medicine

## 2019-09-24 ENCOUNTER — Other Ambulatory Visit: Payer: BC Managed Care – PPO

## 2019-09-24 DIAGNOSIS — Z1231 Encounter for screening mammogram for malignant neoplasm of breast: Secondary | ICD-10-CM

## 2019-09-30 ENCOUNTER — Other Ambulatory Visit: Payer: Self-pay | Admitting: Family Medicine

## 2019-09-30 DIAGNOSIS — R928 Other abnormal and inconclusive findings on diagnostic imaging of breast: Secondary | ICD-10-CM

## 2019-10-06 ENCOUNTER — Other Ambulatory Visit: Payer: Self-pay | Admitting: Family Medicine

## 2019-10-06 DIAGNOSIS — Z13820 Encounter for screening for osteoporosis: Secondary | ICD-10-CM

## 2019-10-06 DIAGNOSIS — N959 Unspecified menopausal and perimenopausal disorder: Secondary | ICD-10-CM

## 2019-10-08 ENCOUNTER — Other Ambulatory Visit: Payer: Self-pay

## 2019-10-08 ENCOUNTER — Ambulatory Visit
Admission: RE | Admit: 2019-10-08 | Discharge: 2019-10-08 | Disposition: A | Discharge status: Home / Self Care | Payer: BC Managed Care – PPO | Source: Ambulatory Visit | Attending: Family Medicine | Admitting: Family Medicine

## 2019-10-08 DIAGNOSIS — Z13820 Encounter for screening for osteoporosis: Secondary | ICD-10-CM

## 2019-10-08 DIAGNOSIS — N959 Unspecified menopausal and perimenopausal disorder: Secondary | ICD-10-CM

## 2019-10-09 ENCOUNTER — Other Ambulatory Visit: Payer: BC Managed Care – PPO

## 2019-10-13 ENCOUNTER — Ambulatory Visit: Payer: BC Managed Care – PPO

## 2019-10-13 ENCOUNTER — Other Ambulatory Visit: Payer: Self-pay

## 2019-10-13 ENCOUNTER — Ambulatory Visit
Admission: RE | Admit: 2019-10-13 | Discharge: 2019-10-13 | Disposition: A | Discharge status: Home / Self Care | Payer: BC Managed Care – PPO | Source: Ambulatory Visit | Attending: Family Medicine | Admitting: Family Medicine

## 2019-10-13 DIAGNOSIS — R928 Other abnormal and inconclusive findings on diagnostic imaging of breast: Secondary | ICD-10-CM

## 2020-05-17 ENCOUNTER — Telehealth: Payer: Self-pay

## 2020-05-17 NOTE — Telephone Encounter (Signed)
Patient due for MRCP - mailed letter to patient asking her to call our office to get this scheduled.

## 2020-05-17 NOTE — Telephone Encounter (Signed)
-----   Message from Marlon Pel, RN sent at 05/17/2020 12:25 PM EDT -----  ----- Message ----- From: Marlon Pel, RN Sent: 05/16/2020 To: Marlon Pel, RN  Needs MRCP see results 05/16/2018

## 2021-07-07 ENCOUNTER — Other Ambulatory Visit: Payer: Self-pay | Admitting: Family Medicine

## 2021-07-07 DIAGNOSIS — M8580 Other specified disorders of bone density and structure, unspecified site: Secondary | ICD-10-CM

## 2021-07-11 ENCOUNTER — Other Ambulatory Visit: Payer: Self-pay | Admitting: Family Medicine

## 2021-07-11 DIAGNOSIS — Z1231 Encounter for screening mammogram for malignant neoplasm of breast: Secondary | ICD-10-CM

## 2021-08-24 ENCOUNTER — Other Ambulatory Visit: Payer: Self-pay

## 2021-08-24 ENCOUNTER — Ambulatory Visit
Admission: RE | Admit: 2021-08-24 | Discharge: 2021-08-24 | Disposition: A | Discharge status: Home / Self Care | Payer: Medicare PPO | Source: Ambulatory Visit | Attending: Family Medicine | Admitting: Family Medicine

## 2021-08-24 DIAGNOSIS — Z1231 Encounter for screening mammogram for malignant neoplasm of breast: Secondary | ICD-10-CM

## 2021-08-29 ENCOUNTER — Ambulatory Visit: Payer: BC Managed Care – PPO

## 2021-08-30 ENCOUNTER — Other Ambulatory Visit: Payer: Self-pay | Admitting: Family Medicine

## 2021-08-30 DIAGNOSIS — R928 Other abnormal and inconclusive findings on diagnostic imaging of breast: Secondary | ICD-10-CM

## 2021-09-20 ENCOUNTER — Other Ambulatory Visit: Payer: Self-pay

## 2021-09-20 ENCOUNTER — Ambulatory Visit
Admission: RE | Admit: 2021-09-20 | Discharge: 2021-09-20 | Disposition: A | Discharge status: Home / Self Care | Payer: Medicare PPO | Source: Ambulatory Visit | Attending: Family Medicine | Admitting: Family Medicine

## 2021-09-20 DIAGNOSIS — R928 Other abnormal and inconclusive findings on diagnostic imaging of breast: Secondary | ICD-10-CM

## 2021-11-17 ENCOUNTER — Ambulatory Visit
Admission: RE | Admit: 2021-11-17 | Discharge: 2021-11-17 | Disposition: A | Discharge status: Home / Self Care | Payer: Medicare PPO | Source: Ambulatory Visit | Attending: Family Medicine | Admitting: Family Medicine

## 2021-11-17 DIAGNOSIS — M8580 Other specified disorders of bone density and structure, unspecified site: Secondary | ICD-10-CM

## 2021-12-06 ENCOUNTER — Other Ambulatory Visit: Payer: BC Managed Care – PPO

## 2022-02-28 ENCOUNTER — Other Ambulatory Visit: Payer: Self-pay | Admitting: Obstetrics & Gynecology

## 2022-02-28 DIAGNOSIS — R1032 Left lower quadrant pain: Secondary | ICD-10-CM

## 2022-03-01 ENCOUNTER — Ambulatory Visit
Admission: RE | Admit: 2022-03-01 | Discharge: 2022-03-01 | Disposition: A | Discharge status: Home / Self Care | Payer: Medicare PPO | Source: Ambulatory Visit | Attending: Obstetrics & Gynecology | Admitting: Obstetrics & Gynecology

## 2022-03-01 DIAGNOSIS — R1032 Left lower quadrant pain: Secondary | ICD-10-CM

## 2022-03-20 ENCOUNTER — Emergency Department (HOSPITAL_BASED_OUTPATIENT_CLINIC_OR_DEPARTMENT_OTHER): Payer: Medicare PPO

## 2022-03-20 ENCOUNTER — Encounter (HOSPITAL_BASED_OUTPATIENT_CLINIC_OR_DEPARTMENT_OTHER): Payer: Self-pay

## 2022-03-20 ENCOUNTER — Emergency Department (HOSPITAL_BASED_OUTPATIENT_CLINIC_OR_DEPARTMENT_OTHER)
Admission: EM | Admit: 2022-03-20 | Discharge: 2022-03-20 | Disposition: A | Discharge status: Home / Self Care | Payer: Medicare PPO | Attending: Emergency Medicine | Admitting: Emergency Medicine

## 2022-03-20 ENCOUNTER — Other Ambulatory Visit: Payer: Self-pay

## 2022-03-20 DIAGNOSIS — R1030 Lower abdominal pain, unspecified: Secondary | ICD-10-CM

## 2022-03-20 DIAGNOSIS — R102 Pelvic and perineal pain: Secondary | ICD-10-CM | POA: Diagnosis present

## 2022-03-20 LAB — CBC
HCT: 40.2 % (ref 36.0–46.0)
Hemoglobin: 13.3 g/dL (ref 12.0–15.0)
MCH: 30.4 pg (ref 26.0–34.0)
MCHC: 33.1 g/dL (ref 30.0–36.0)
MCV: 91.8 fL (ref 80.0–100.0)
Platelets: 178 10*3/uL (ref 150–400)
RBC: 4.38 MIL/uL (ref 3.87–5.11)
RDW: 13.4 % (ref 11.5–15.5)
WBC: 4.1 10*3/uL (ref 4.0–10.5)
nRBC: 0 % (ref 0.0–0.2)

## 2022-03-20 LAB — COMPREHENSIVE METABOLIC PANEL
ALT: 14 U/L (ref 0–44)
AST: 18 U/L (ref 15–41)
Albumin: 4 g/dL (ref 3.5–5.0)
Alkaline Phosphatase: 45 U/L (ref 38–126)
Anion gap: 10 (ref 5–15)
BUN: 11 mg/dL (ref 8–23)
CO2: 25 mmol/L (ref 22–32)
Calcium: 9.3 mg/dL (ref 8.9–10.3)
Chloride: 104 mmol/L (ref 98–111)
Creatinine, Ser: 0.85 mg/dL (ref 0.44–1.00)
GFR, Estimated: >60 mL/min (ref >60)
Glucose, Bld: 113 mg/dL — ABNORMAL HIGH (ref 70–99)
Potassium: 3.7 mmol/L (ref 3.5–5.1)
Sodium: 139 mmol/L (ref 135–145)
Total Bilirubin: 0.6 mg/dL (ref 0.3–1.2)
Total Protein: 6.5 g/dL (ref 6.5–8.1)

## 2022-03-20 LAB — URINALYSIS, ROUTINE W REFLEX MICROSCOPIC
Bilirubin Urine: NEGATIVE
Glucose, UA: NEGATIVE mg/dL
Hgb urine dipstick: NEGATIVE
Ketones, ur: NEGATIVE mg/dL
Leukocytes,Ua: NEGATIVE
Nitrite: NEGATIVE
Protein, ur: NEGATIVE mg/dL
Specific Gravity, Urine: 1.011 (ref 1.005–1.030)
pH: 7 (ref 5.0–8.0)

## 2022-03-20 LAB — LIPASE, BLOOD: Lipase: 39 U/L (ref 11–51)

## 2022-03-20 MED ORDER — IOHEXOL 9 MG/ML PO SOLN
500.0000 mL | Freq: Once | ORAL | Status: AC | PRN
  Administered 2022-03-20: 500 mL via ORAL

## 2022-03-20 MED ORDER — LACTATED RINGERS IV BOLUS
500.0000 mL | Freq: Once | INTRAVENOUS | Status: AC
  Administered 2022-03-20: 500 mL via INTRAVENOUS

## 2022-03-20 MED ORDER — IOHEXOL 300 MG/ML  SOLN
100.0000 mL | Freq: Once | INTRAMUSCULAR | Status: AC | PRN
  Administered 2022-03-20: 80 mL via INTRAVENOUS

## 2022-03-20 NOTE — ED Notes (Signed)
Patient returned from Imaging at this Time. ?

## 2022-03-20 NOTE — ED Provider Notes (Signed)
?[facility] ?Provider Note ? ? ?CSN: [id number] ?Arrival date & time: 03/20/22  1046 ? ?  ? ?History ? ?Chief Complaint  ?Patient presents with  ? Abdominal Pain  ? ? ?Elizabeth Cabrera is a 71 y.o. female. ? ?HPI ?71 year old female presents today complaining of crampy pain in her ?Pelvis worse on the right than on the left.  She was seen by her GYN and had an ultrasound performed that she reports is no acute abnormality.  Pain has been off and on for the past 6 weeks.  It woke her up from sleep this morning.  She has not had any nausea, vomiting, urinary symptoms, fever, chills, or or abnormal bowel movements.  She has had some increase in her estradiol and thinks that it may be connected to this. ? ?  ? ?Home Medications ?Prior to Admission medications   ?Medication Sig Start Date End Date Taking? Authorizing Provider  ?CREATINE PO creatine   Yes [provider]  ?DOTTI 0.025 MG/24HR Place 1 patch onto the skin 2 (two) times a week. 03/08/22  Yes [provider]  ?Magnesium 400 MG TABS Take 400 mg by mouth daily.   Yes [provider]  ?MAGNESIUM CITRATE PO Take by mouth.   Yes [provider]  ?Multiple Vitamins-Minerals (MULTIVITAMIN ADULT PO) Take by mouth.   Yes [provider]  ?OVER THE COUNTER MEDICATION Liposomal glutathione liquid   Yes [provider]  ?OVER THE COUNTER MEDICATION Liposomal vit c   Yes [provider]  ?[place] Methylation complete   Yes [provider]  ?[place] Brain save- 2 daily   Yes [provider]  ?[place] detox   Yes [provider]  ?Prasterone, DHEA, (DHEA PO) Take 1 tablet by mouth See admin instructions. 3 times weekly   Yes [provider]  ?Pregnenolone Micronized (PREGNENOLONE PO) pregnenolone (bulk) ? 2 days a week   Yes [provider]  ?PRESCRIPTION MEDICATION Testosterone  4 % cream -apply 1/4 gram behind knees daily   Yes [provider]  ?Probiotic Product (PROBIOTIC PO) Probiotic   Yes [provider]  ?progesterone (PROMETRIUM) 100 MG capsule Take 1 capsule by mouth daily.   Yes [provider]  ?thyroid (ARMOUR) 60 MG tablet Take 60 mg by mouth daily.   Yes [provider]  ?VITAMIN D PO Vitamin D   Yes [provider]  ?ALPRAZolam (XANAX) 0.5 MG tablet Take 1 tablet (0.5 mg total) by mouth at bedtime as needed. 07/21/14   Leandrew Koyanagi, MD  ?valACYclovir (VALTREX) 500 MG tablet Take 1 tablet (500 mg total) by mouth 2 (two) times daily. 01/29/16   Leandrew Koyanagi, MD  ?   ? ?Allergies    ?Patient has no known allergies.   ? ?Review of Systems   ?Review of Systems ? ?Physical Exam ?Updated Vital Signs ?BP (!) 150/77 (BP Location: Left Arm)   Pulse 85   Temp 98.3 ?F (36.8 ?C)   Resp 20   Ht 1.702 m ('5\' 7"'$ )   Wt 63 kg   SpO2 100%   BMI 21.77 kg/m?  ?Physical Exam ?Vitals and nursing note reviewed.  ?Constitutional:   ?   General: She is not in acute distress. ?   Appearance: She is well-developed.  ?HENT:  ?   Head: Normocephalic and atraumatic.  ?   Right Ear: External ear normal.  ?  Left Ear: External ear normal.  ?   Nose: Nose normal.  ?Eyes:  ?   Conjunctiva/sclera: Conjunctivae normal.  ?   Pupils: Pupils are equal, round, and reactive to light.  ?Pulmonary:  ?   Effort: Pulmonary effort is normal.  ?Abdominal:  ?   General: Abdomen is flat. Bowel sounds are normal.  ?   Palpations: Abdomen is soft.  ?   Tenderness: There is no abdominal tenderness.  ?Musculoskeletal:     ?   General: Normal range of motion.  ?   Cervical back: Normal range of motion and neck supple.  ?Skin: ?   General: Skin is warm and dry.  ?Neurological:  ?   Mental Status: She is alert and oriented to person, place, and time.  ?   Motor: No abnormal muscle tone.  ?   Coordination: Coordination normal.  ?Psychiatric:     ?   Behavior: Behavior  normal.     ?   Thought Content: Thought content normal.  ? ? ?ED Results / Procedures / Treatments   ?Labs ?(all labs ordered are listed, but only abnormal results are displayed) ?Labs Reviewed  ?COMPREHENSIVE METABOLIC PANEL - Abnormal; Notable for the following components:  ?    Result Value  ? Glucose, Bld 113 (*)   ? All other components within normal limits  ?LIPASE, BLOOD  ?CBC  ?URINALYSIS, ROUTINE W REFLEX MICROSCOPIC  ? ? ?EKG ?None ? ?Radiology ?CT ABDOMEN PELVIS W CONTRAST ? ?Result Date: 03/20/2022 ?CLINICAL DATA:  Acute right lower quadrant abdominal pain. EXAM: CT ABDOMEN AND PELVIS WITH CONTRAST TECHNIQUE: Multidetector CT imaging of the abdomen and pelvis was performed using the standard protocol following bolus administration of intravenous contrast. RADIATION DOSE REDUCTION: This exam was performed according to the departmental dose-optimization program which includes automated exposure control, adjustment of the mA and/or kV according to patient size and/or use of iterative reconstruction technique. CONTRAST:  38m OMNIPAQUE IOHEXOL 300 MG/ML  SOLN COMPARISON:  None Available. FINDINGS: Lower chest: No acute abnormality. Hepatobiliary: No focal liver abnormality is seen. No gallstones, gallbladder wall thickening, or biliary dilatation. Pancreas: Unremarkable. No pancreatic ductal dilatation or surrounding inflammatory changes. Spleen: Normal in size without focal abnormality. Adrenals/Urinary Tract: Adrenal glands are unremarkable. Kidneys are normal, without renal calculi, focal lesion, or hydronephrosis. Bladder is unremarkable. Stomach/Bowel: Stomach is within normal limits. Appendix appears normal. No evidence of bowel wall thickening, distention, or inflammatory changes. Vascular/Lymphatic: No significant vascular findings are present. No enlarged abdominal or pelvic lymph nodes. Reproductive: Uterus and bilateral adnexa are unremarkable. Other: No abdominal wall hernia or abnormality. No  abdominopelvic ascites. Musculoskeletal: No acute or significant osseous findings. IMPRESSION: No definite abnormality seen in the abdomen or pelvis. Electronically Signed   By: JMarijo ConceptionM.D.   On: 03/20/2022 13:00   ? ?Procedures ?Procedures  ? ? ?Medications Ordered in ED ?Medications  ?lactated ringers bolus 500 mL (0 mLs Intravenous Stopped 03/20/22 1312)  ?iohexol (OMNIPAQUE) 9 MG/ML oral solution 500 mL (500 mLs Oral Contrast Given 03/20/22 1140)  ?iohexol (OMNIPAQUE) 300 MG/ML solution 100 mL (80 mLs Intravenous Contrast Given 03/20/22 1239)  ? ? ?ED Course/ Medical Decision Making/ A&P ?Clinical Course as of 03/20/22 1315  ?Tue Mar 20, 2022  ?1309 CT abdomen reviewed and interpreted with no evidence of acute abnormality noted and radiologist interpretation concurs, specifically appendix visualized and normal, no liver or gallbladder abnormality or gallstones noted.  Pancreas unremarkable no evidence of bowel wall thickening distention  or inflammatory changes are noted [DR]  ?  ?Clinical Course User Index ?[DR] Pattricia Boss, MD  ? ?                        ?Medical Decision Making ?71 year old female with crampy pelvic pain.  Patient previously evaluated with ultrasound and ultrasound results reviewed and appear to have no acute abnormalities. ?Patient evaluated here with labs, urinalysis, and CT and no acute abnormality is noted.  We have discussed that further evaluation may be needed with her gastroenterology and possibly endoscopy. ?She is hemodynamically stable here and has not been having significant pain while waiting here. ?She has been able to take p.o. without difficulty. ?Differential diagnosis includes but is not limited to urinary tract infection, appendicitis, neoplasm, colitis, diverticulitis, other abnormalities of the female genitalia tract, and other infectious etiologies ?Above evaluation has essentially ruled out severe significant acute intra-abdominal or pelvic etiologies.  I have  discussed need for additional evaluation as an outpatient and return precautions and voiced patient voices understanding. ? ?Amount and/or Complexity of Data Reviewed ?External Data Reviewed: radiology. ?   Detai

## 2022-03-20 NOTE — Discharge Instructions (Addendum)
Your labs and urinalysis are normal here today. ?Your CAT scan shows no evidence of acute appendicitis, colitis, diverticulitis or other lower abdominal wall abnormalities. ?However, if your symptoms are worsening, you should return for reevaluation ?You should follow-up with them as an outpatient with your gastroenterologist and primary care physician for further evaluation if pain is ongoing ?

## 2022-03-20 NOTE — ED Triage Notes (Signed)
Pt c/o ongoing lower abdominal pain, more so on the right. Pt denies N/V/D, urinary symptoms. Seen at OBGYN recently for same and had US performed.  ?

## 2022-03-20 NOTE — ED Notes (Signed)
RN provided AVS using Teachback Method. Patient verbalizes understanding of Discharge Instructions. Opportunity for Questioning and Answers were provided by RN. Patient Discharged from ED ambulatory to Home via Self.  

## 2022-06-28 DIAGNOSIS — H6123 Impacted cerumen, bilateral: Secondary | ICD-10-CM | POA: Diagnosis not present

## 2022-07-09 DIAGNOSIS — M25561 Pain in right knee: Secondary | ICD-10-CM | POA: Diagnosis not present

## 2022-07-16 DIAGNOSIS — M25561 Pain in right knee: Secondary | ICD-10-CM | POA: Diagnosis not present

## 2022-07-30 DIAGNOSIS — M25561 Pain in right knee: Secondary | ICD-10-CM | POA: Diagnosis not present

## 2022-08-06 IMAGING — US US PELVIS COMPLETE WITH TRANSVAGINAL
1 series · 14 of 25 positions shown · non-contrast
Comparison: None

CLINICAL DATA: LEFT lower quadrant pain bilaterally, postmenopausal

EXAM:
TRANSABDOMINAL AND TRANSVAGINAL ULTRASOUND OF PELVIS
TECHNIQUE: Both transabdominal and transvaginal ultrasound examinations of the
pelvis were performed. Transabdominal technique was performed for
global imaging of the pelvis including uterus, ovaries, adnexal
regions, and pelvic cul-de-sac. It was necessary to proceed with
endovaginal exam following the transabdominal exam to visualize the
lower uterine segment and RIGHT ovary.

[Series 1: us pelvis complete with transvaginal · 0.15mm/px · 14 of 73 slices shown]
[im 1/73]
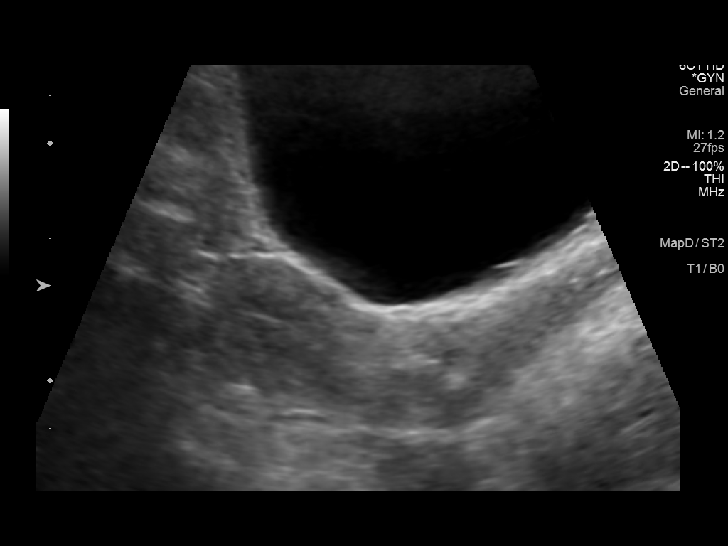
[im 7/73]
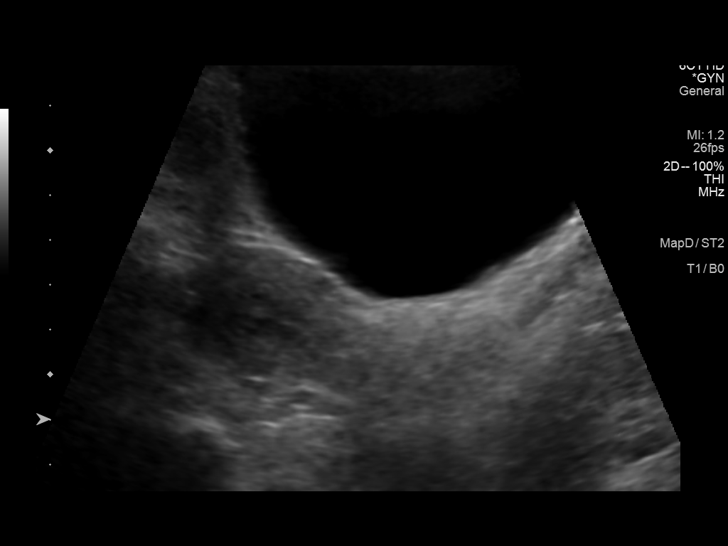
[im 13/73]
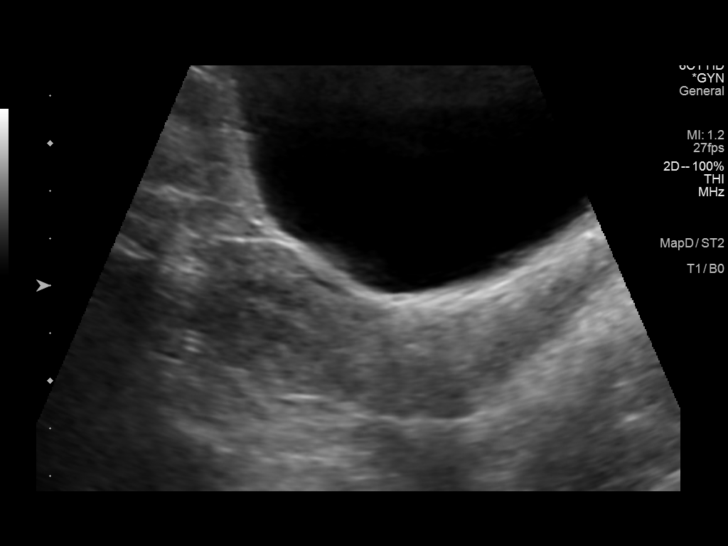
[im 19/73]
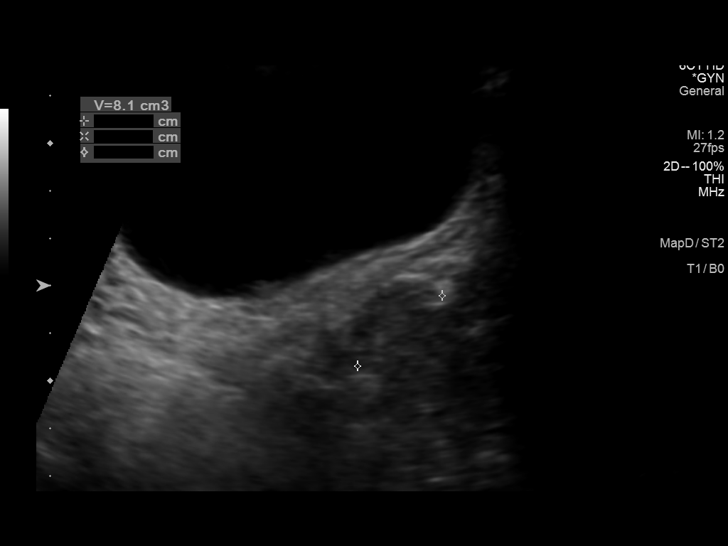
[im 25/73]
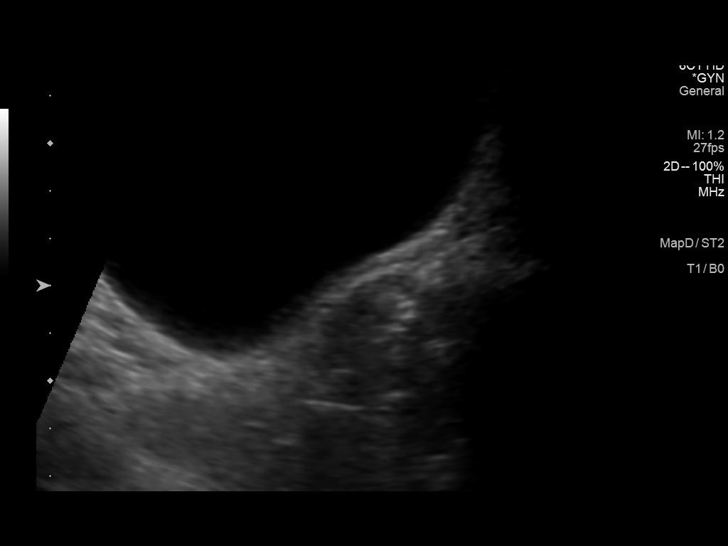
[im 28/73]
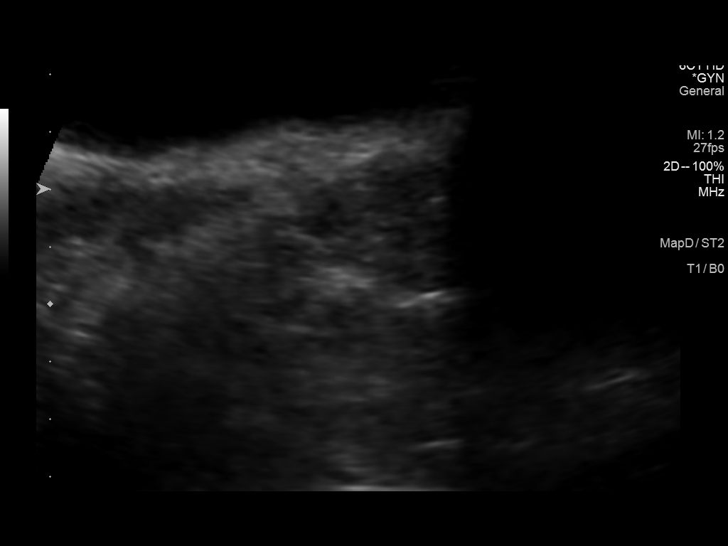
[im 34/73]
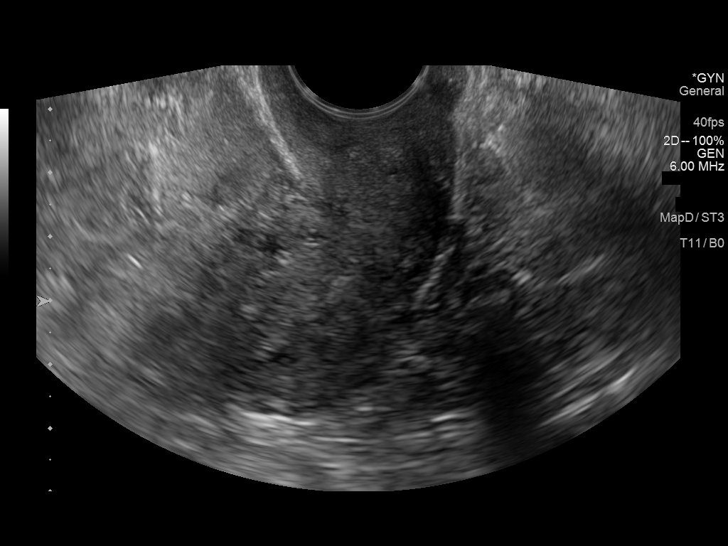
[im 40/73]
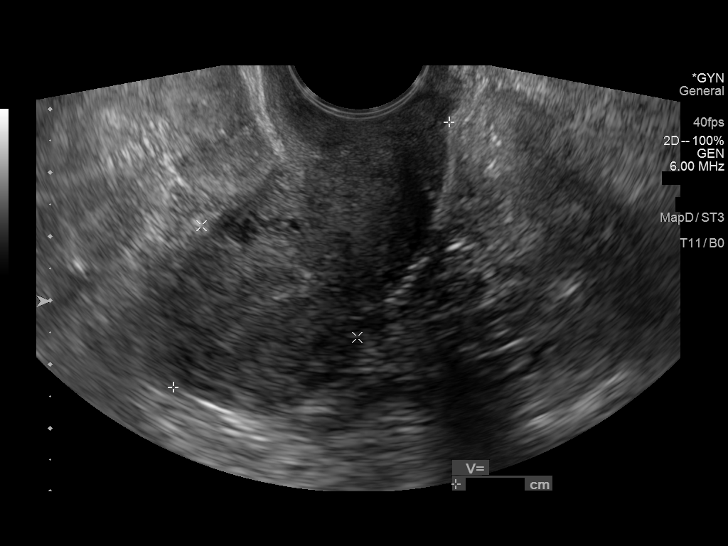
[im 46/73]
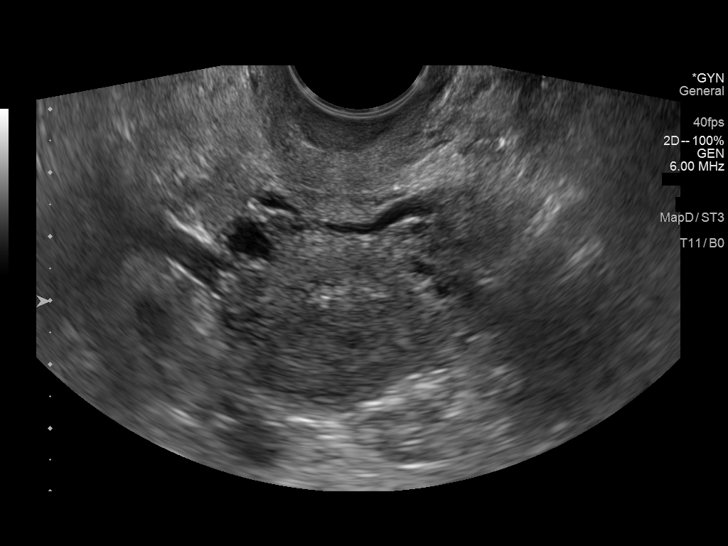
[im 49/73]
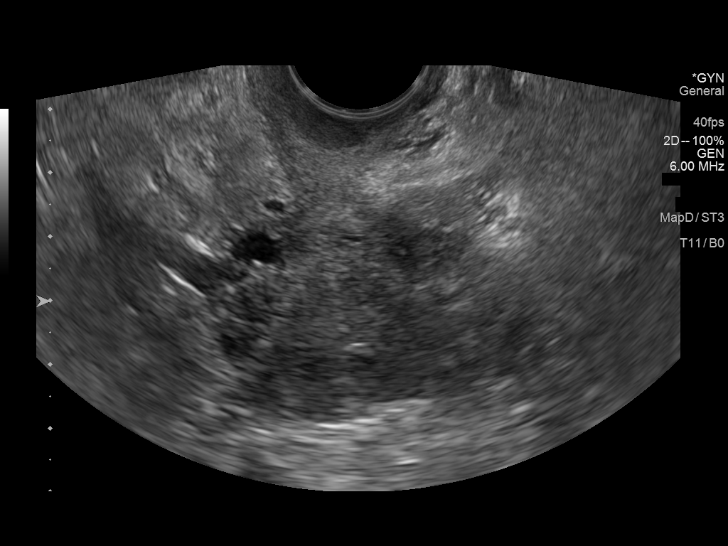
[im 55/73]
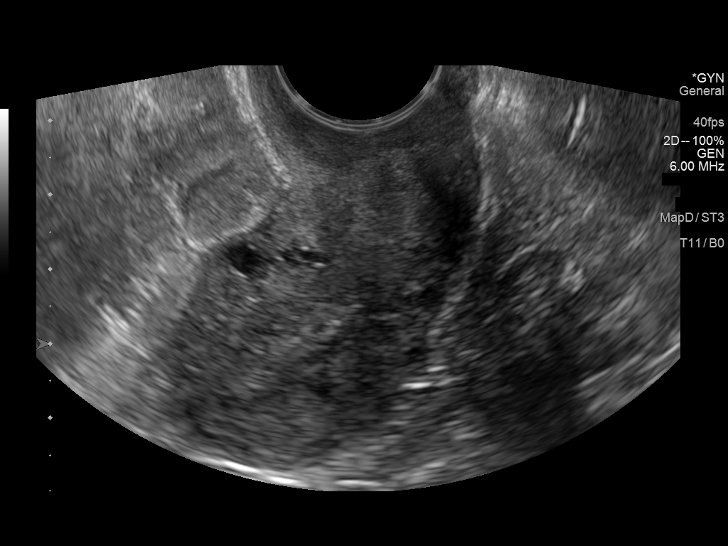
[im 61/73]
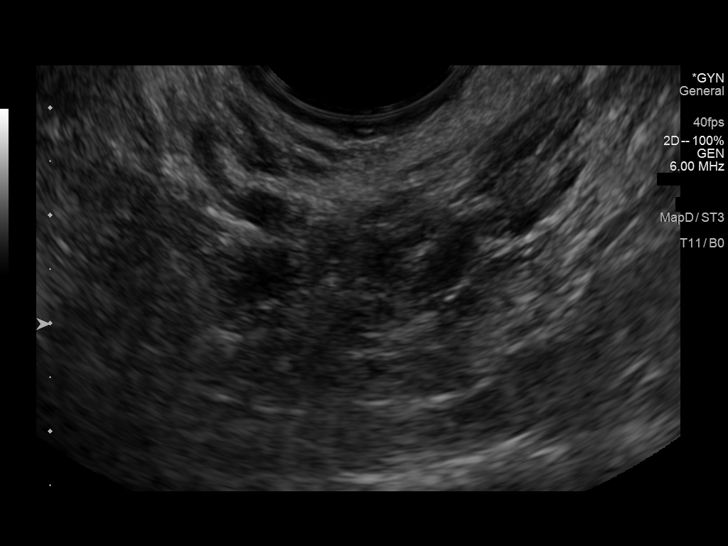
[im 67/73]
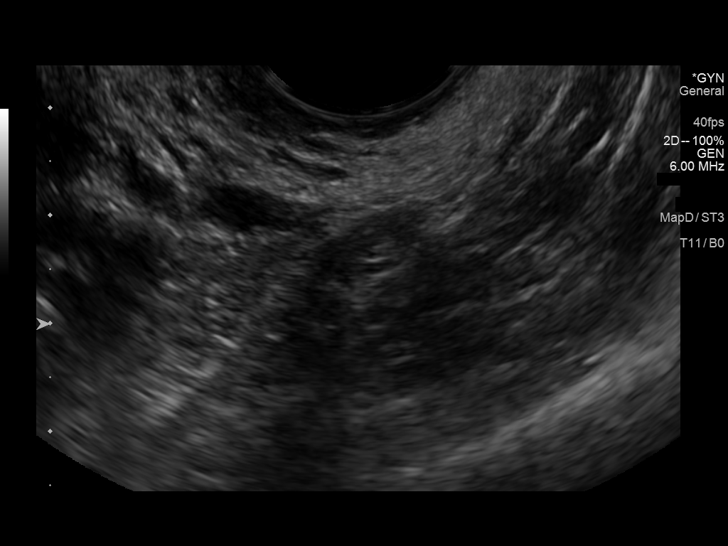
[im 73/73]
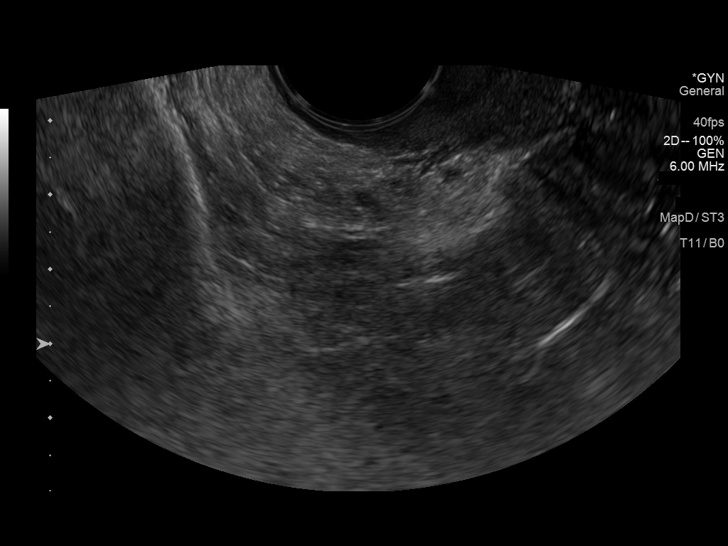

[14 of 25 positions shown; findings below may reference images not displayed]

FINDINGS: Uterus

Measurements: 6.5 x 2.8 x 3.3 cm = volume: 31 mL. Anteverted.
Heterogeneous myometrium. No focal mass.

Endometrium

Thickness: 4 mm.  No endometrial fluid or mass

Right ovary

Not visualized, likely obscured by bowel

Left ovary

Measurements: 2.1 x 1.5 x 1.2 cm = volume: 1.9 mL. Normal morphology
without mass

Other findings

No free pelvic fluid.  No adnexal masses.
IMPRESSION: Nonvisualization of RIGHT ovary.

Otherwise normal exam.

## 2022-08-08 DIAGNOSIS — M25561 Pain in right knee: Secondary | ICD-10-CM | POA: Diagnosis not present

## 2022-08-11 DIAGNOSIS — M25561 Pain in right knee: Secondary | ICD-10-CM | POA: Diagnosis not present

## 2022-08-20 DIAGNOSIS — M25561 Pain in right knee: Secondary | ICD-10-CM | POA: Diagnosis not present

## 2022-08-25 IMAGING — CT CT ABD-PELV W/ CM
2 of 5 series · 17 of 46 positions shown, 19 images · IV contrast (APPLIED)
Comparison: None Available.

CLINICAL DATA: Acute right lower quadrant abdominal pain.

EXAM:
CT ABDOMEN AND PELVIS WITH CONTRAST
TECHNIQUE: Multidetector CT imaging of the abdomen and pelvis was performed
using the standard protocol following bolus administration of
intravenous contrast.

[Series 2: abd pel w · axial · 0.74mm/px · z∈[+890,+1270]mm · 14 of 86 slices shown, 16 images]
[im 5/86  soft-tissue]
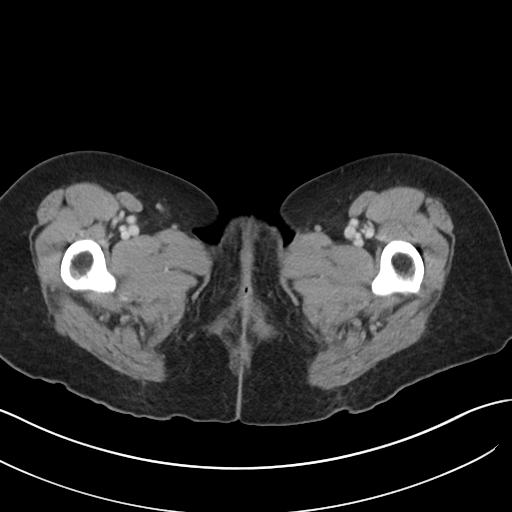
[im 5/86  bone]
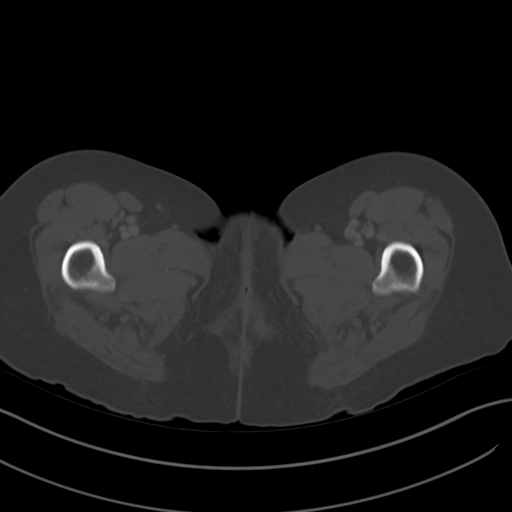
[im 10/86  soft-tissue]
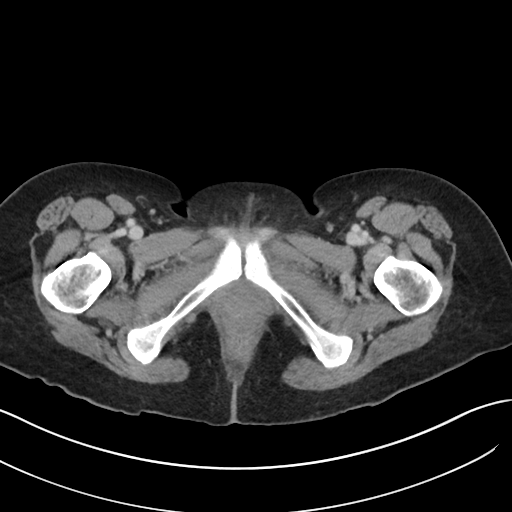
[im 19/86  soft-tissue]
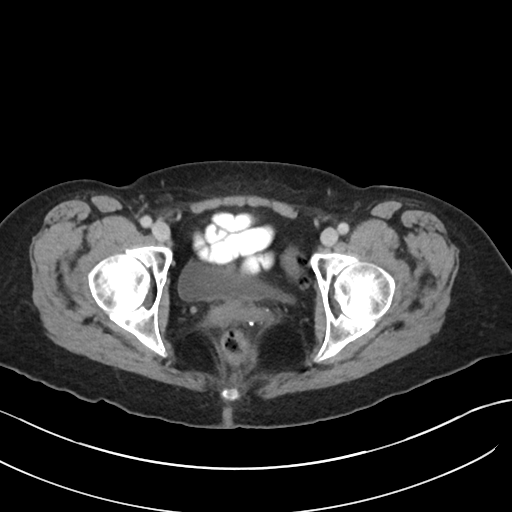
[im 24/86  soft-tissue]
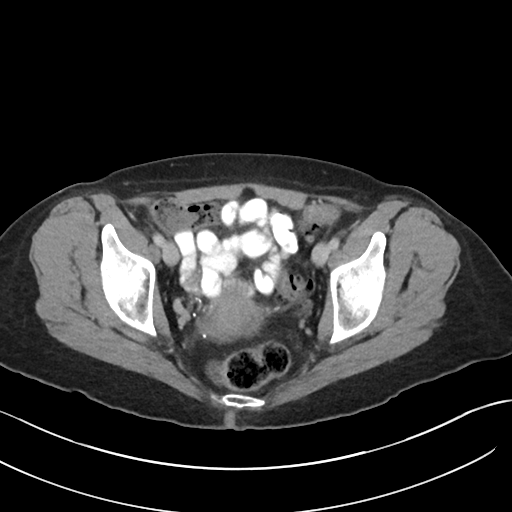
[im 29/86  soft-tissue]
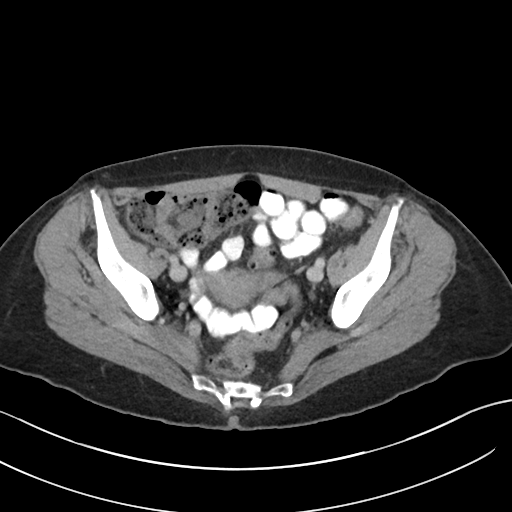
[im 34/86  soft-tissue]
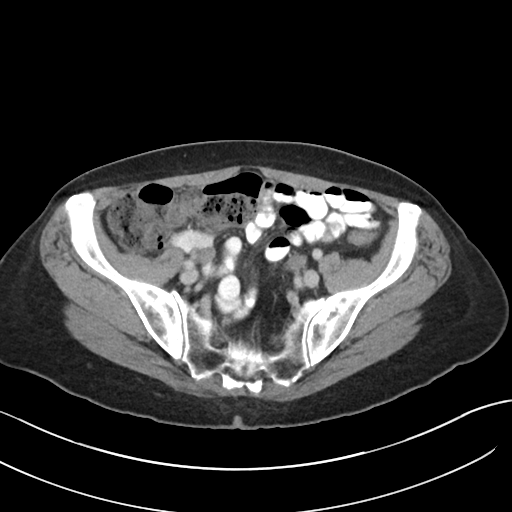
[im 38/86  soft-tissue]
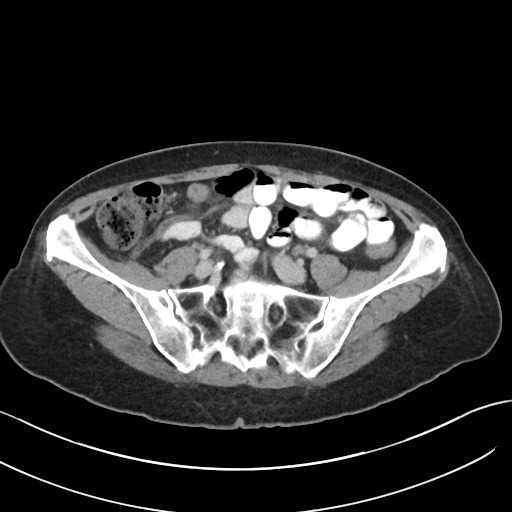
[im 48/86  soft-tissue]
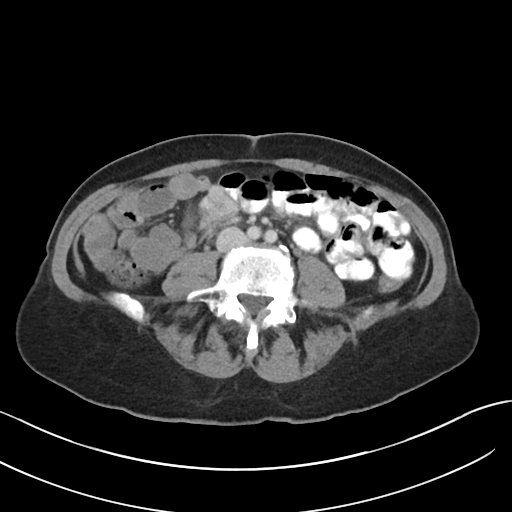
[im 52/86  soft-tissue]
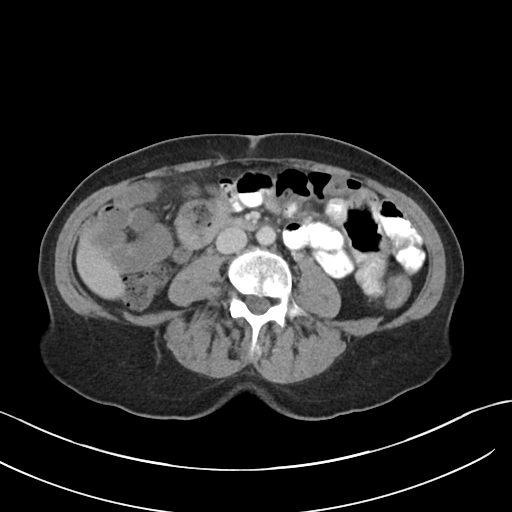
[im 52/86  bone]
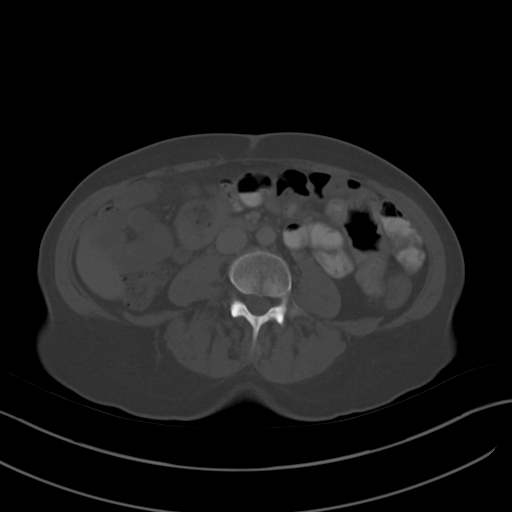
[im 57/86  soft-tissue]
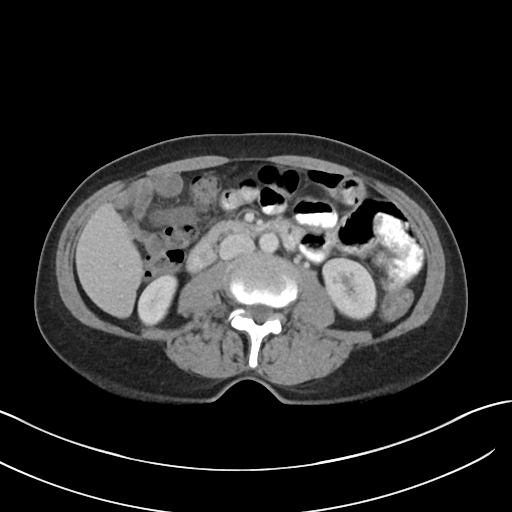
[im 62/86  soft-tissue]
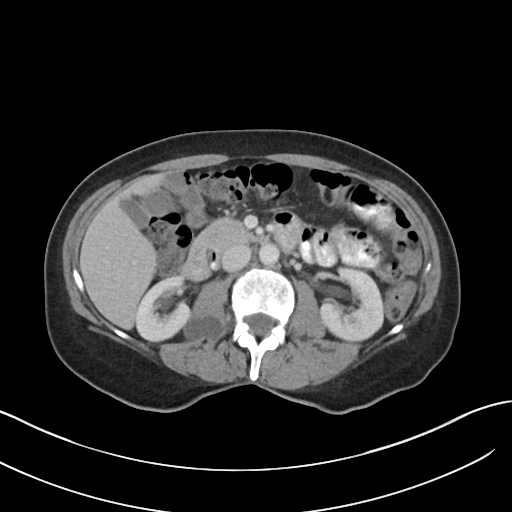
[im 67/86  soft-tissue]
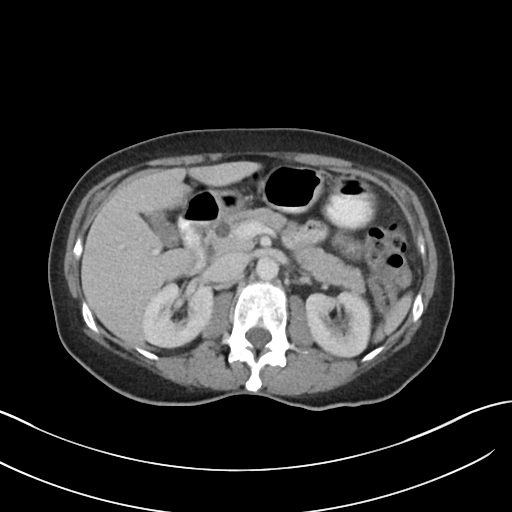
[im 76/86  soft-tissue]
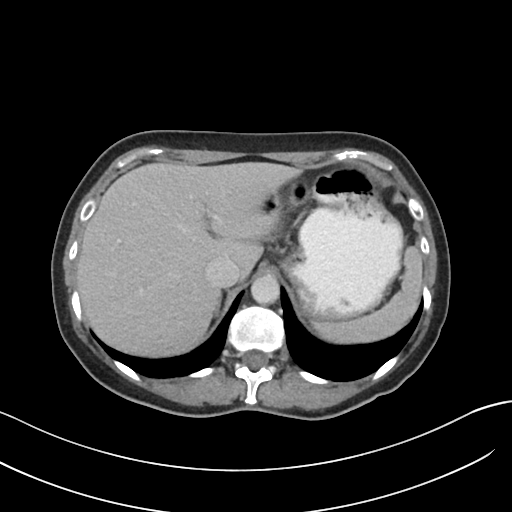
[im 81/86  soft-tissue]
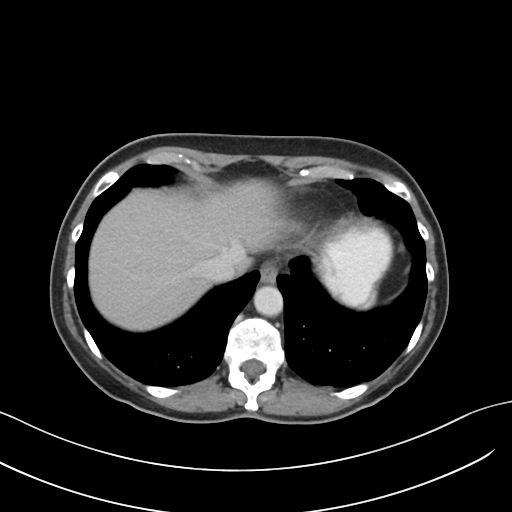

[Series 5: coronal · coronal · 0.80mm/px · 3 of 83 slices shown]
[im 28/83  soft-tissue]
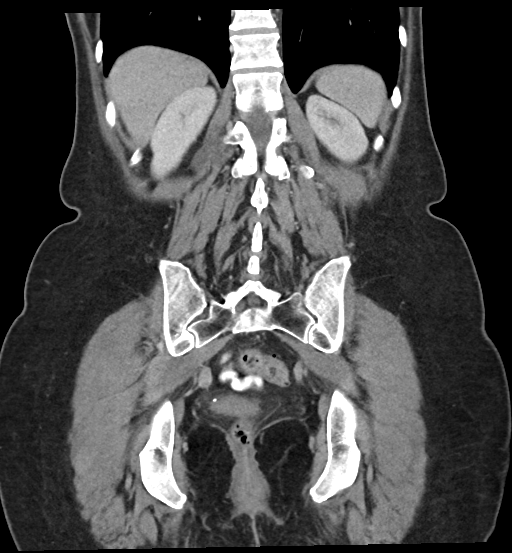
[im 37/83  soft-tissue]
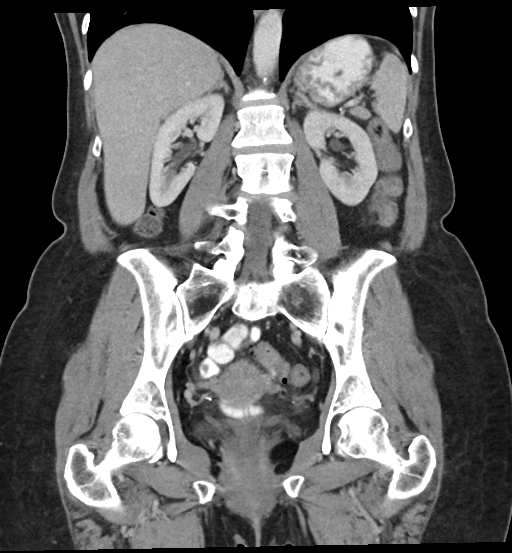
[im 46/83  soft-tissue]
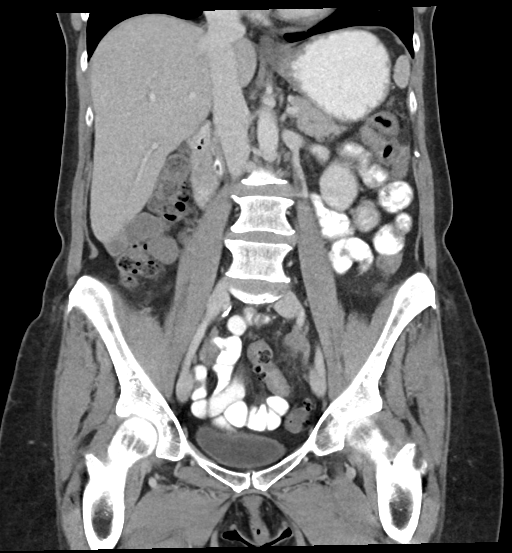

[17 of 46 positions shown; findings below may reference images not displayed]

RADIATION DOSE REDUCTION: This exam was performed according to the
departmental dose-optimization program which includes automated
exposure control, adjustment of the mA and/or kV according to
patient size and/or use of iterative reconstruction technique.

CONTRAST:  80mL OMNIPAQUE IOHEXOL 300 MG/ML  SOLN
FINDINGS: Lower chest: No acute abnormality.

Hepatobiliary: No focal liver abnormality is seen. No gallstones,
gallbladder wall thickening, or biliary dilatation.

Pancreas: Unremarkable. No pancreatic ductal dilatation or
surrounding inflammatory changes.

Spleen: Normal in size without focal abnormality.

Adrenals/Urinary Tract: Adrenal glands are unremarkable. Kidneys are
normal, without renal calculi, focal lesion, or hydronephrosis.
Bladder is unremarkable.

Stomach/Bowel: Stomach is within normal limits. Appendix appears
normal. No evidence of bowel wall thickening, distention, or
inflammatory changes.

Vascular/Lymphatic: No significant vascular findings are present. No
enlarged abdominal or pelvic lymph nodes.

Reproductive: Uterus and bilateral adnexa are unremarkable.

Other: No abdominal wall hernia or abnormality. No abdominopelvic
ascites.

Musculoskeletal: No acute or significant osseous findings.
IMPRESSION: No definite abnormality seen in the abdomen or pelvis.

## 2022-08-27 DIAGNOSIS — M25561 Pain in right knee: Secondary | ICD-10-CM | POA: Diagnosis not present

## 2022-09-17 DIAGNOSIS — M25561 Pain in right knee: Secondary | ICD-10-CM | POA: Diagnosis not present

## 2022-09-18 DIAGNOSIS — M25561 Pain in right knee: Secondary | ICD-10-CM | POA: Diagnosis not present

## 2022-09-18 DIAGNOSIS — M25551 Pain in right hip: Secondary | ICD-10-CM | POA: Diagnosis not present

## 2022-09-25 DIAGNOSIS — M25529 Pain in unspecified elbow: Secondary | ICD-10-CM | POA: Diagnosis not present

## 2022-10-11 DIAGNOSIS — M25569 Pain in unspecified knee: Secondary | ICD-10-CM | POA: Diagnosis not present

## 2022-11-12 DIAGNOSIS — M25569 Pain in unspecified knee: Secondary | ICD-10-CM | POA: Diagnosis not present

## 2022-12-05 DIAGNOSIS — S83241A Other tear of medial meniscus, current injury, right knee, initial encounter: Secondary | ICD-10-CM | POA: Diagnosis not present

## 2022-12-10 DIAGNOSIS — M25561 Pain in right knee: Secondary | ICD-10-CM | POA: Diagnosis not present

## 2022-12-12 DIAGNOSIS — S83241A Other tear of medial meniscus, current injury, right knee, initial encounter: Secondary | ICD-10-CM | POA: Diagnosis not present

## 2022-12-19 DIAGNOSIS — M25569 Pain in unspecified knee: Secondary | ICD-10-CM | POA: Diagnosis not present

## 2022-12-24 DIAGNOSIS — Z0000 Encounter for general adult medical examination without abnormal findings: Secondary | ICD-10-CM | POA: Diagnosis not present

## 2022-12-24 DIAGNOSIS — Z23 Encounter for immunization: Secondary | ICD-10-CM | POA: Diagnosis not present

## 2022-12-24 DIAGNOSIS — M85851 Other specified disorders of bone density and structure, right thigh: Secondary | ICD-10-CM | POA: Diagnosis not present

## 2022-12-24 DIAGNOSIS — K582 Mixed irritable bowel syndrome: Secondary | ICD-10-CM | POA: Diagnosis not present

## 2022-12-24 DIAGNOSIS — E039 Hypothyroidism, unspecified: Secondary | ICD-10-CM | POA: Diagnosis not present

## 2022-12-24 DIAGNOSIS — Z6821 Body mass index (BMI) 21.0-21.9, adult: Secondary | ICD-10-CM | POA: Diagnosis not present

## 2022-12-24 DIAGNOSIS — M25551 Pain in right hip: Secondary | ICD-10-CM | POA: Diagnosis not present

## 2022-12-24 DIAGNOSIS — E7800 Pure hypercholesterolemia, unspecified: Secondary | ICD-10-CM | POA: Diagnosis not present

## 2022-12-24 DIAGNOSIS — Z1331 Encounter for screening for depression: Secondary | ICD-10-CM | POA: Diagnosis not present

## 2022-12-24 DIAGNOSIS — S83203D Other tear of unspecified meniscus, current injury, right knee, subsequent encounter: Secondary | ICD-10-CM | POA: Diagnosis not present

## 2023-01-16 DIAGNOSIS — M25569 Pain in unspecified knee: Secondary | ICD-10-CM | POA: Diagnosis not present

## 2023-01-23 DIAGNOSIS — R0689 Other abnormalities of breathing: Secondary | ICD-10-CM | POA: Diagnosis not present

## 2023-01-23 DIAGNOSIS — S29012A Strain of muscle and tendon of back wall of thorax, initial encounter: Secondary | ICD-10-CM | POA: Diagnosis not present

## 2023-01-28 DIAGNOSIS — F418 Other specified anxiety disorders: Secondary | ICD-10-CM | POA: Diagnosis not present

## 2023-01-28 DIAGNOSIS — Z6823 Body mass index (BMI) 23.0-23.9, adult: Secondary | ICD-10-CM | POA: Diagnosis not present

## 2023-01-28 DIAGNOSIS — M546 Pain in thoracic spine: Secondary | ICD-10-CM | POA: Diagnosis not present

## 2023-01-28 DIAGNOSIS — R0602 Shortness of breath: Secondary | ICD-10-CM | POA: Diagnosis not present

## 2023-01-31 DIAGNOSIS — M25569 Pain in unspecified knee: Secondary | ICD-10-CM | POA: Diagnosis not present

## 2023-02-21 DIAGNOSIS — M25569 Pain in unspecified knee: Secondary | ICD-10-CM | POA: Diagnosis not present

## 2023-05-13 DIAGNOSIS — H6121 Impacted cerumen, right ear: Secondary | ICD-10-CM | POA: Diagnosis not present

## 2023-07-15 DIAGNOSIS — L650 Telogen effluvium: Secondary | ICD-10-CM | POA: Diagnosis not present

## 2023-07-15 DIAGNOSIS — D2261 Melanocytic nevi of right upper limb, including shoulder: Secondary | ICD-10-CM | POA: Diagnosis not present

## 2023-07-15 DIAGNOSIS — L738 Other specified follicular disorders: Secondary | ICD-10-CM | POA: Diagnosis not present

## 2023-07-15 DIAGNOSIS — L821 Other seborrheic keratosis: Secondary | ICD-10-CM | POA: Diagnosis not present

## 2023-07-15 DIAGNOSIS — D485 Neoplasm of uncertain behavior of skin: Secondary | ICD-10-CM | POA: Diagnosis not present

## 2023-07-15 DIAGNOSIS — D2272 Melanocytic nevi of left lower limb, including hip: Secondary | ICD-10-CM | POA: Diagnosis not present

## 2023-07-15 DIAGNOSIS — L720 Epidermal cyst: Secondary | ICD-10-CM | POA: Diagnosis not present

## 2023-07-15 DIAGNOSIS — Z85828 Personal history of other malignant neoplasm of skin: Secondary | ICD-10-CM | POA: Diagnosis not present

## 2023-07-15 DIAGNOSIS — D225 Melanocytic nevi of trunk: Secondary | ICD-10-CM | POA: Diagnosis not present

## 2023-07-31 DIAGNOSIS — N951 Menopausal and female climacteric states: Secondary | ICD-10-CM | POA: Diagnosis not present

## 2023-07-31 DIAGNOSIS — N644 Mastodynia: Secondary | ICD-10-CM | POA: Diagnosis not present

## 2023-07-31 DIAGNOSIS — Z01419 Encounter for gynecological examination (general) (routine) without abnormal findings: Secondary | ICD-10-CM | POA: Diagnosis not present

## 2023-11-02 DIAGNOSIS — R52 Pain, unspecified: Secondary | ICD-10-CM | POA: Diagnosis not present

## 2023-11-02 DIAGNOSIS — J029 Acute pharyngitis, unspecified: Secondary | ICD-10-CM | POA: Diagnosis not present

## 2023-11-02 DIAGNOSIS — R051 Acute cough: Secondary | ICD-10-CM | POA: Diagnosis not present

## 2023-11-02 DIAGNOSIS — R6883 Chills (without fever): Secondary | ICD-10-CM | POA: Diagnosis not present

## 2023-11-02 DIAGNOSIS — R509 Fever, unspecified: Secondary | ICD-10-CM | POA: Diagnosis not present

## 2023-11-29 ENCOUNTER — Other Ambulatory Visit: Payer: Self-pay | Admitting: Obstetrics & Gynecology

## 2023-11-29 DIAGNOSIS — Z1231 Encounter for screening mammogram for malignant neoplasm of breast: Secondary | ICD-10-CM

## 2023-12-09 DIAGNOSIS — M7121 Synovial cyst of popliteal space [Baker], right knee: Secondary | ICD-10-CM | POA: Diagnosis not present

## 2023-12-09 DIAGNOSIS — H6123 Impacted cerumen, bilateral: Secondary | ICD-10-CM | POA: Diagnosis not present

## 2023-12-09 DIAGNOSIS — R300 Dysuria: Secondary | ICD-10-CM | POA: Diagnosis not present

## 2023-12-09 DIAGNOSIS — G8929 Other chronic pain: Secondary | ICD-10-CM | POA: Diagnosis not present

## 2023-12-09 DIAGNOSIS — M25561 Pain in right knee: Secondary | ICD-10-CM | POA: Diagnosis not present

## 2023-12-09 DIAGNOSIS — Z6822 Body mass index (BMI) 22.0-22.9, adult: Secondary | ICD-10-CM | POA: Diagnosis not present

## 2023-12-11 ENCOUNTER — Ambulatory Visit
Admission: RE | Admit: 2023-12-11 | Discharge: 2023-12-11 | Disposition: A | Discharge status: Home / Self Care | Payer: Medicare PPO | Source: Ambulatory Visit | Attending: Obstetrics & Gynecology | Admitting: Obstetrics & Gynecology

## 2023-12-11 DIAGNOSIS — Z1231 Encounter for screening mammogram for malignant neoplasm of breast: Secondary | ICD-10-CM

## 2023-12-17 DIAGNOSIS — M25561 Pain in right knee: Secondary | ICD-10-CM | POA: Diagnosis not present

## 2024-01-04 DIAGNOSIS — R269 Unspecified abnormalities of gait and mobility: Secondary | ICD-10-CM | POA: Diagnosis not present

## 2024-01-04 DIAGNOSIS — M25561 Pain in right knee: Secondary | ICD-10-CM | POA: Diagnosis not present

## 2024-01-04 DIAGNOSIS — M7121 Synovial cyst of popliteal space [Baker], right knee: Secondary | ICD-10-CM | POA: Diagnosis not present

## 2024-01-20 DIAGNOSIS — E039 Hypothyroidism, unspecified: Secondary | ICD-10-CM | POA: Diagnosis not present

## 2024-01-20 DIAGNOSIS — E7800 Pure hypercholesterolemia, unspecified: Secondary | ICD-10-CM | POA: Diagnosis not present

## 2024-01-20 DIAGNOSIS — F418 Other specified anxiety disorders: Secondary | ICD-10-CM | POA: Diagnosis not present

## 2024-01-20 DIAGNOSIS — A6000 Herpesviral infection of urogenital system, unspecified: Secondary | ICD-10-CM | POA: Diagnosis not present

## 2024-01-20 DIAGNOSIS — K582 Mixed irritable bowel syndrome: Secondary | ICD-10-CM | POA: Diagnosis not present

## 2024-01-20 DIAGNOSIS — Z1211 Encounter for screening for malignant neoplasm of colon: Secondary | ICD-10-CM | POA: Diagnosis not present

## 2024-01-20 DIAGNOSIS — Z0000 Encounter for general adult medical examination without abnormal findings: Secondary | ICD-10-CM | POA: Diagnosis not present

## 2024-01-20 DIAGNOSIS — Z780 Asymptomatic menopausal state: Secondary | ICD-10-CM | POA: Diagnosis not present

## 2024-01-20 DIAGNOSIS — M85851 Other specified disorders of bone density and structure, right thigh: Secondary | ICD-10-CM | POA: Diagnosis not present

## 2024-01-21 DIAGNOSIS — Z0000 Encounter for general adult medical examination without abnormal findings: Secondary | ICD-10-CM | POA: Diagnosis not present

## 2024-02-11 ENCOUNTER — Ambulatory Visit (INDEPENDENT_AMBULATORY_CARE_PROVIDER_SITE_OTHER)

## 2024-02-11 ENCOUNTER — Ambulatory Visit: Admitting: Podiatry

## 2024-02-11 ENCOUNTER — Encounter: Payer: Self-pay | Admitting: Podiatry

## 2024-02-11 DIAGNOSIS — M722 Plantar fascial fibromatosis: Secondary | ICD-10-CM

## 2024-02-11 DIAGNOSIS — A6000 Herpesviral infection of urogenital system, unspecified: Secondary | ICD-10-CM | POA: Insufficient documentation

## 2024-02-11 DIAGNOSIS — D1724 Benign lipomatous neoplasm of skin and subcutaneous tissue of left leg: Secondary | ICD-10-CM | POA: Diagnosis not present

## 2024-02-11 DIAGNOSIS — D1723 Benign lipomatous neoplasm of skin and subcutaneous tissue of right leg: Secondary | ICD-10-CM

## 2024-02-11 DIAGNOSIS — F419 Anxiety disorder, unspecified: Secondary | ICD-10-CM | POA: Insufficient documentation

## 2024-02-11 DIAGNOSIS — K582 Mixed irritable bowel syndrome: Secondary | ICD-10-CM | POA: Insufficient documentation

## 2024-02-11 DIAGNOSIS — E559 Vitamin D deficiency, unspecified: Secondary | ICD-10-CM | POA: Insufficient documentation

## 2024-02-11 DIAGNOSIS — G4733 Obstructive sleep apnea (adult) (pediatric): Secondary | ICD-10-CM | POA: Insufficient documentation

## 2024-02-11 DIAGNOSIS — Z1211 Encounter for screening for malignant neoplasm of colon: Secondary | ICD-10-CM | POA: Insufficient documentation

## 2024-02-11 NOTE — Progress Notes (Signed)
 Subjective:  Patient ID: Elizabeth Cabrera, female    DOB: 1951/04/04,  MRN: [id number] HPI Chief Complaint  Patient presents with   Ankle Pain    Lateral ankle bilateral (L>R) - swollen area x few years, PT thought could be a cyst, would like evaluated to see if there was something that could be done, also PF left foot that flares up from time to time   New Patient (Initial Visit)    73 y.o. female presents with the above complaint.   ROS: Denies fever chills nausea vomiting muscle aches pains calf pain back pain chest pain shortness of breath.  Past Medical History:  Diagnosis Date   Anxiety    AR (allergic rhinitis)    BCC (basal cell carcinoma)    Elevated cholesterol    Endometriosis    History of HPV infection    History of IBS    IPMN (intraductal papillary mucinous neoplasm)- side branch and cyst 01/01/2017   1.  Pancreatic head side branch IPMN measuring 7 mm for which follow-up imaging is recommended in 2-3 years for surveillance. Additional 8 mm cystic lesion at the pancreatic body without communication to the pancreatic duct. This can also be followed up at surveillance imaging in 2-3 years. 2.  Partial pancreatic divisum. 3.  Scattered tiny hepatic cysts.   Menopause    Pancreatic cyst    Vitamin D deficiency    Past Surgical History:  Procedure Laterality Date   BREAST EXCISIONAL BIOPSY Left 1985   fibroadenoma removed   BREAST FIBROADENOMA SURGERY     TONSILLECTOMY AND ADENOIDECTOMY      Current Outpatient Medications:    CREATINE PO, creatine, Disp: , Rfl:    DOTTI 0.025 MG/24HR, Place 1 patch onto the skin 2 (two) times a week., Disp: , Rfl:    Magnesium 400 MG TABS, Take 400 mg by mouth daily., Disp: , Rfl:    MAGNESIUM CITRATE PO, Take by mouth., Disp: , Rfl:    Multiple Vitamins-Minerals (MULTIVITAMIN ADULT PO), Take by mouth., Disp: , Rfl:    OVER THE COUNTER MEDICATION, Liposomal glutathione liquid, Disp: , Rfl:    OVER THE COUNTER MEDICATION,  Liposomal vit c, Disp: , Rfl:    OVER THE COUNTER MEDICATION, Methylation complete, Disp: , Rfl:    OVER THE COUNTER MEDICATION, Brain save- 2 daily, Disp: , Rfl:    OVER THE COUNTER MEDICATION, Gi detox, Disp: , Rfl:    Prasterone, DHEA, (DHEA PO), Take 1 tablet by mouth See admin instructions. 3 times weekly, Disp: , Rfl:    Pregnenolone Micronized (PREGNENOLONE PO), pregnenolone (bulk)  2 days a week, Disp: , Rfl:    PRESCRIPTION MEDICATION, Testosterone 4 % cream -apply 1/4 gram behind knees daily, Disp: , Rfl:    Probiotic Product (PROBIOTIC PO), Probiotic, Disp: , Rfl:    progesterone (PROMETRIUM) 100 MG capsule, Take 1 capsule by mouth daily., Disp: , Rfl:    thyroid (ARMOUR) 60 MG tablet, Take 60 mg by mouth daily., Disp: , Rfl:    valACYclovir (VALTREX) 500 MG tablet, Take 1 tablet (500 mg total) by mouth 2 (two) times daily., Disp: 20 tablet, Rfl: 5   VITAMIN D PO, Vitamin D, Disp: , Rfl:   Allergies  Allergen Reactions   Milk-Related Compounds     Other Reaction(s): itching   Cephalexin Rash   Gluten Meal     Other Reaction(s): GI Intolerance   Review of Systems Objective:  There were no vitals filed for this  visit.  General: Well developed, nourished, in no acute distress, alert and oriented x3   Dermatological: Skin is warm, dry and supple bilateral. Nails x 10 are well maintained; remaining integument appears unremarkable at this time. There are no open sores, no preulcerative lesions, no rash or signs of infection present.  Soft tissue masses anterior lateral ankles larger than normal tender on palpation and with shoe gear.  Vascular: Dorsalis Pedis artery and Posterior Tibial artery pedal pulses are 2/4 bilateral with immedate capillary fill time. Pedal hair growth present. No varicosities and no lower extremity edema present bilateral.   Neruologic: Grossly intact via light touch bilateral. Vibratory intact via tuning fork bilateral. Protective threshold with Semmes  Wienstein monofilament intact to all pedal sites bilateral. Patellar and Achilles deep tendon reflexes 2+ bilateral. No Babinski or clonus noted bilateral.   Musculoskeletal: No gross boney pedal deformities bilateral. No pain, crepitus, or limitation noted with foot and ankle range of motion bilateral. Muscular strength 5/5 in all groups tested bilateral.  Gait: Unassisted, Nonantalgic.    Radiographs:  Radiographs taken today do not demonstrate any significant osseous abnormalities ankles but does demonstrate soft tissue tumors anterior lateral ankles overlying sinus tarsi.  Assessment & Plan:   Assessment: Painful lipomas bilateral.  Anterior lateral ankle.  Plan: Discussed etiology pathology conservative surgical therapies at this point she does not want to have these removed stating that they do not bother her enough that she we will reconsider if she would care because of symptoms to worsen.     Analise Glotfelty T. [place], [place]

## 2024-02-17 DIAGNOSIS — M25561 Pain in right knee: Secondary | ICD-10-CM | POA: Diagnosis not present

## 2024-02-17 DIAGNOSIS — M7121 Synovial cyst of popliteal space [Baker], right knee: Secondary | ICD-10-CM | POA: Diagnosis not present

## 2024-02-17 DIAGNOSIS — R269 Unspecified abnormalities of gait and mobility: Secondary | ICD-10-CM | POA: Diagnosis not present

## 2024-08-11 DIAGNOSIS — H6121 Impacted cerumen, right ear: Secondary | ICD-10-CM | POA: Diagnosis not present

## 2024-08-25 DIAGNOSIS — D225 Melanocytic nevi of trunk: Secondary | ICD-10-CM | POA: Diagnosis not present

## 2024-08-25 DIAGNOSIS — L814 Other melanin hyperpigmentation: Secondary | ICD-10-CM | POA: Diagnosis not present

## 2024-08-25 DIAGNOSIS — L718 Other rosacea: Secondary | ICD-10-CM | POA: Diagnosis not present

## 2024-08-25 DIAGNOSIS — L720 Epidermal cyst: Secondary | ICD-10-CM | POA: Diagnosis not present

## 2024-08-25 DIAGNOSIS — L820 Inflamed seborrheic keratosis: Secondary | ICD-10-CM | POA: Diagnosis not present

## 2024-08-25 DIAGNOSIS — L821 Other seborrheic keratosis: Secondary | ICD-10-CM | POA: Diagnosis not present

## 2024-08-25 DIAGNOSIS — D692 Other nonthrombocytopenic purpura: Secondary | ICD-10-CM | POA: Diagnosis not present

## 2024-08-25 DIAGNOSIS — Z85828 Personal history of other malignant neoplasm of skin: Secondary | ICD-10-CM | POA: Diagnosis not present

## 2024-08-25 DIAGNOSIS — D1801 Hemangioma of skin and subcutaneous tissue: Secondary | ICD-10-CM | POA: Diagnosis not present
